# Patient Record
Sex: Female | Born: 1995
Health system: Southern US, Community
[De-identification: ages and names within clinical notes are randomized; demographics above are authoritative.]

## PROBLEM LIST (undated history)

## (undated) DIAGNOSIS — F419 Anxiety disorder, unspecified: Secondary | ICD-10-CM

## (undated) DIAGNOSIS — F329 Major depressive disorder, single episode, unspecified: Secondary | ICD-10-CM

## (undated) DIAGNOSIS — L709 Acne, unspecified: Secondary | ICD-10-CM

## (undated) DIAGNOSIS — F32A Depression, unspecified: Secondary | ICD-10-CM

## (undated) HISTORY — PX: WISDOM TOOTH EXTRACTION: SHX21

## (undated) HISTORY — DX: Anxiety disorder, unspecified: F41.9

## (undated) HISTORY — DX: Acne, unspecified: L70.9

---
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
## 2014-01-28 ENCOUNTER — Ambulatory Visit (INDEPENDENT_AMBULATORY_CARE_PROVIDER_SITE_OTHER): Payer: 59 | Admitting: Physician Assistant

## 2014-01-28 VITALS — BP 100/74 | HR 86 | Temp 98.2°F | Resp 16 | Ht 69.0 in | Wt 183.6 lb

## 2014-01-28 DIAGNOSIS — K2100 Gastro-esophageal reflux disease with esophagitis, without bleeding: Secondary | ICD-10-CM

## 2014-01-28 MED ORDER — RANITIDINE HCL 150 MG PO TABS: 150.0000 mg | ORAL_TABLET | Freq: Two times a day (BID) | ORAL | Status: DC

## 2014-01-28 MED ORDER — ESOMEPRAZOLE MAGNESIUM 40 MG PO CPDR: 40.0000 mg | DELAYED_RELEASE_CAPSULE | Freq: Every day | ORAL | Status: DC

## 2014-01-28 MED ORDER — SUCRALFATE 1 GM/10ML PO SUSP: 1.0000 g | Freq: Three times a day (TID) | ORAL | Status: DC

## 2014-01-28 NOTE — Patient Instructions (Signed)
Decrease your Prozac to 20 mg every day.

## 2014-01-28 NOTE — Progress Notes (Signed)
Subjective:    Patient ID: Jennifer Whitaker, female    DOB: 07/28/1995, 18 y.o.   MRN: [id number]  HPI Pt presents to clinic with upper abdominal pain that has been going on for a while but over the last 3 days it has gotten worse.  She is also feeling like she has something stuck in her throat and she is clearing her throat more than normal.  She is having some PND with a slight cold but she does not think that is her entire problem with the throat clearing and it started a several weeks ago.  She does not notice a difference in some foods causing problems and other not.  She does not smoke or drink ETOH or caffiene beverages.  She is having nausea when she eats and a pain in her epigastric area.  It has been getting worse.  She was diagnosed with laryngeal reflux as a child and took prevacid for years her only symptoms then was chronic clearing of her throat.  Patient Active Problem List   Diagnosis Date Noted  . Insomnia 12/15/2013  . Acne 09/19/2013  . Adjustment disorder with mixed anxiety and depressed mood 05/29/2013  . Reaction to severe stress 05/29/2013  . HSV (herpes simplex virus) infection 11/26/2012   Prior to Admission medications   Medication Sig Start Date End Date Taking? Authorizing Provider  buPROPion (WELLBUTRIN XL) 300 MG 24 hr tablet Take 1 tablet (300 mg total) by mouth daily. 12/15/13  Yes Tonye Pearsonobert P Doolittle, MD  clonazePAM (KLONOPIN) 0.5 MG tablet Take 1 tablet (0.5 mg total) by mouth 3 (three) times daily as needed for anxiety. 08/03/13  Yes Tonye Pearsonobert P Doolittle, MD  FLUoxetine (PROZAC) 20 MG capsule TAKE 3 CAPSULES BY MOUTH DAILY. Patient taking differently: Take 20 mg by mouth daily. TAKE 3 CAPSULES BY MOUTH DAILY. 08/03/13  Yes Tonye Pearsonobert P Doolittle, MD  norgestrel-ethinyl estradiol (LO/OVRAL,CRYSELLE) 0.3-30 MG-MCG tablet Take 1 tablet by mouth daily. 08/03/13  Yes Tonye Pearsonobert P Doolittle, MD  traZODone (DESYREL) 50 MG tablet Take 0.5-1 tablets (25-50 mg total) by mouth at  bedtime as needed for sleep. 12/15/13  Yes Tonye Pearsonobert P Doolittle, MD  valACYclovir (VALTREX) 1000 MG tablet Take 1 tablet (1,000 mg total) by mouth daily. Patient taking differently: Take 1,000 mg by mouth daily as needed.  11/26/12  Yes Sherren MochaEva N Shaw, MD       Morrell RiddleSarah L Janos Shampine, PA-C       Morrell RiddleSarah L Corri Delapaz, PA-C       Morrell RiddleSarah L Deneka Greenwalt, PA-C   No Known Allergies  Medications, allergies, past medical history, surgical history, family history, social history and problem list reviewed and updated.   Gwen - [address] - [facility] - therapist since April and feeling good about it - she feels like her mood is good about once a week she has a day where her mood is bad and she does not want to get out of bed.  She feels like the Wellbutrin is working and she tolerated the decrease in Prozac without problems.    Review of Systems  Constitutional: Negative for fever and chills.  Gastrointestinal: Positive for nausea (only with eating) and abdominal pain (epigsatric). Negative for vomiting, diarrhea and constipation.  Psychiatric/Behavioral: Positive for dysphoric mood. Negative for suicidal ideas and self-injury. The patient is nervous/anxious.        Objective:   Physical Exam  Constitutional: She is oriented to person, place, and time. She appears well-developed and well-nourished.  BP 100/74 mmHg  Pulse 86  Temp(Src) 98.2 F (36.8 C) (Oral)  Resp 16  Ht 5\' 9"  (1.753 m)  Wt 183 lb 9.6 oz (83.28 kg)  BMI 27.10 kg/m2  SpO2 99%  LMP 01/20/2014   HENT:  Head: Normocephalic and atraumatic.  Right Ear: External ear normal.  Left Ear: External ear normal.  Eyes: Conjunctivae are normal.  Cardiovascular: Normal rate, regular rhythm and normal heart sounds.   No murmur heard. Pulmonary/Chest: Effort normal and breath sounds normal. She has no wheezes.  Abdominal: Soft. Bowel sounds are normal. There is tenderness (mild epigastric TTP).  Neurological: She is alert and oriented to person, place, and  time.  Skin: Skin is warm and dry.  Psychiatric: She has a normal mood and affect. Her behavior is normal. Judgment and thought content normal.       Assessment & Plan:  Gastroesophageal reflux disease with esophagitis - Plan: ranitidine (ZANTAC) 150 MG tablet, esomeprazole (NEXIUM) 40 MG capsule, sucralfate (CARAFATE) 1 GM/10ML suspension  Adjustment reaction with depression and anxiety - continue to decrease Prozac to 20mg  and stay on her Wellbutrin 300mg .    Jennifer LennertSarah Shaden Lacher PA-C  [facility] 01/28/2014 5:52 PM

## 2014-02-26 ENCOUNTER — Telehealth: Payer: Self-pay

## 2014-02-26 MED ORDER — PANTOPRAZOLE SODIUM 40 MG PO TBEC: 40.0000 mg | DELAYED_RELEASE_TABLET | Freq: Every day | ORAL | Status: DC

## 2014-02-26 NOTE — Telephone Encounter (Signed)
Pt's new insurance plan now has Pantoprazole for free. Wants to know if she can switch to this instead on Nexium. Thanks [facility]  

## 2014-02-26 NOTE — Telephone Encounter (Signed)
Meds ordered this encounter  Medications  . pantoprazole (PROTONIX) 40 MG tablet    Sig: Take 1 tablet (40 mg total) by mouth daily.    Dispense:  90 tablet    Refill:  1    Order Specific Question:  Supervising Provider    Answer:  DOOLITTLE, ROBERT P [[id number]]

## 2014-02-26 NOTE — Telephone Encounter (Signed)
Pt's mother notified.

## 2014-05-12 ENCOUNTER — Other Ambulatory Visit: Payer: Self-pay | Admitting: Internal Medicine

## 2014-05-23 ENCOUNTER — Other Ambulatory Visit: Payer: Self-pay

## 2014-05-23 MED ORDER — CLONAZEPAM 0.5 MG PO TABS: 0.5000 mg | ORAL_TABLET | Freq: Three times a day (TID) | ORAL | Status: DC | PRN

## 2014-05-23 NOTE — Telephone Encounter (Signed)
Rx. Faxed Clonazepam.

## 2014-05-23 NOTE — Telephone Encounter (Signed)
Pt requesting refills on Clonazepam. Please advise.

## 2014-06-06 ENCOUNTER — Ambulatory Visit (INDEPENDENT_AMBULATORY_CARE_PROVIDER_SITE_OTHER): Payer: 59 | Admitting: Internal Medicine

## 2014-06-06 VITALS — BP 110/70 | HR 89 | Temp 98.2°F | Resp 16 | Ht 71.0 in | Wt 179.4 lb

## 2014-06-06 DIAGNOSIS — F4323 Adjustment disorder with mixed anxiety and depressed mood: Secondary | ICD-10-CM

## 2014-06-06 DIAGNOSIS — F439 Reaction to severe stress, unspecified: Secondary | ICD-10-CM | POA: Diagnosis not present

## 2014-06-06 DIAGNOSIS — G4700 Insomnia, unspecified: Secondary | ICD-10-CM

## 2014-06-06 MED ORDER — CLONAZEPAM 0.5 MG PO TABS: 0.5000 mg | ORAL_TABLET | Freq: Three times a day (TID) | ORAL | Status: DC | PRN

## 2014-06-06 MED ORDER — BUPROPION HCL ER (XL) 300 MG PO TB24: 300.0000 mg | ORAL_TABLET | Freq: Every day | ORAL | Status: DC

## 2014-06-06 MED ORDER — TRAZODONE HCL 50 MG PO TABS: 25.0000 mg | ORAL_TABLET | Freq: Every evening | ORAL | Status: DC | PRN

## 2014-06-06 MED ORDER — FLUOXETINE HCL 20 MG PO CAPS: ORAL_CAPSULE | ORAL | Status: DC

## 2014-06-06 NOTE — Progress Notes (Signed)
Subjective:    Patient ID: Jennifer SauceJulianne M Lurie, female    DOB: 1995/06/23, 19 y.o.   MRN: [id number] This chart was scribed for Ellamae Siaobert Bena Kobel, MD by Jolene Provostobert Halas, Medical Scribe. This patient was seen in Room 8 and the patient's care was started a 10:18 AM.  Chief Complaint  Patient presents with  . Mental Health Problem    Mental Health Problem The primary symptoms include dysphoric mood.   HPI Comments: Jennifer Whitaker is a 19 y.o. female with a hx of adjustment disorder with mixed anxiety and depressed mood who [facility] complaining of stress from work that has become severe over the last couple of months. Pt states her job is not fun, and is very stressful. Pt states she is considering hurting herself, due to stress at work. Pt states she needs to resign due to incompatibility with the work environment. Pt states she is still taking her Prozac and Wellbutrin, and believes they are at a good level. Pt states she is taking her klonopin once every other day. Pt is still taking her trazodone for sleep.  Lives with boyfriend who is very supportive Mother also supportive  Review of Systems  Psychiatric/Behavioral: Positive for self-injury and dysphoric mood. The patient is nervous/anxious.    cardiovascular no chest pain or palpitations Pulmonary No shortness of breath GI equals no abdominal pain    Objective:   Physical Exam  Constitutional: She is oriented to person, place, and time. She appears well-developed and well-nourished. No distress.  HENT:  Head: Normocephalic and atraumatic.  Eyes: Pupils are equal, round, and reactive to light.  Neck: Neck supple.  Cardiovascular: Normal rate.   Pulmonary/Chest: Effort normal. No respiratory distress.  Musculoskeletal: Normal range of motion.  Neurological: She is alert and oriented to person, place, and time. Coordination normal.  Skin: Skin is warm and dry. She is not diaphoretic.  Psychiatric:  Her mood is one of  great anxiety though her affect seems appropriate as she interprets her level of stress. Her thought content is marred by her level of fear about change her job and the repercussions. There is no acute suicide ideation  Nursing note and vitals reviewed.      Assessment & Plan:  Adjustment disorder with mixed anxiety and depressed mood  Reaction to severe stress  Insomnia   Time to refill medications Meds ordered this encounter  Medications  . FLUoxetine (PROZAC) 20 MG capsule    Sig: TAKE 3 CAPSULES BY MOUTH DAILY.    Dispense:  270 capsule    Refill:  3  . buPROPion (WELLBUTRIN XL) 300 MG 24 hr tablet    Sig: Take 1 tablet (300 mg total) by mouth daily.    Dispense:  90 tablet    Refill:  1  . clonazePAM (KLONOPIN) 0.5 MG tablet    Sig: Take 1 tablet (0.5 mg total) by mouth 3 (three) times daily as needed for anxiety.    Dispense:  20 tablet    Refill:  2  . traZODone (DESYREL) 50 MG tablet    Sig: Take 0.5-1 tablets (25-50 mg total) by mouth at bedtime as needed for sleep.    Dispense:  90 tablet    Refill:  1   Referred to counseling Letter written about need to stop work for medical reasons for the next 4 weeks Follow-up 4 weeks  I have completed the patient encounter in its entirety as documented by the scribe, with editing by  me where necessary. Almas Rake P. Laney Pastor, M.D.

## 2014-07-07 ENCOUNTER — Other Ambulatory Visit: Payer: Self-pay | Admitting: Internal Medicine

## 2014-07-12 ENCOUNTER — Ambulatory Visit (INDEPENDENT_AMBULATORY_CARE_PROVIDER_SITE_OTHER): Payer: 59 | Admitting: Physician Assistant

## 2014-07-12 VITALS — BP 100/70 | HR 122 | Temp 99.7°F | Resp 16 | Ht 69.75 in | Wt 178.4 lb

## 2014-07-12 DIAGNOSIS — K529 Noninfective gastroenteritis and colitis, unspecified: Secondary | ICD-10-CM

## 2014-07-12 DIAGNOSIS — R112 Nausea with vomiting, unspecified: Secondary | ICD-10-CM | POA: Diagnosis not present

## 2014-07-12 MED ORDER — ONDANSETRON 4 MG PO TBDP
4.0000 mg | ORAL_TABLET | Freq: Once | ORAL | Status: AC
  Administered 2014-07-12: 4 mg via ORAL

## 2014-07-12 MED ORDER — ONDANSETRON 4 MG PO TBDP: 4.0000 mg | ORAL_TABLET | Freq: Three times a day (TID) | ORAL | Status: DC | PRN

## 2014-07-12 NOTE — Progress Notes (Signed)
Subjective:    Patient ID: Jennifer SauceJulianne M Menter, female    DOB: 02-Jul-1995, 19 y.o.   MRN: [id number]  HPI Pt presents to clinic with her mother and brother feeling terrible with nausea and vomiting.  She started to feel nauseated last night while she was working but then the vomiting started once she got home.  She has myalgias and has only been able to keep down a few sips of liquid today.  She is very nauseated.  She lost a close friend in a car accident 2 days ago (the friend was coming to visit her and [place] an [place] and she is sad about that and she feels that her depression is not well managed over the last few months and would like to have her medications changed.  Review of Systems  Constitutional: Positive for fever (subjective fever) and chills.  HENT: Negative.   Gastrointestinal: Positive for nausea and vomiting (>3 times - last time about 1 hour). Negative for diarrhea.  Genitourinary: Negative.  Negative for menstrual problem.   Patient Active Problem List   Diagnosis Date Noted  . Insomnia 12/15/2013  . Acne 09/19/2013  . Adjustment disorder with mixed anxiety and depressed mood 05/29/2013  . Reaction to severe stress 05/29/2013  . HSV (herpes simplex virus) infection 11/26/2012   The patient has a current medication list which includes the following prescription(s): bupropion, clonazepam, fluoxetine, norgestrel-ethinyl estradiol, pantoprazole, ranitidine, sucralfate, trazodone, and valacyclovir. No Known Allergies  Medications, allergies, past medical history, surgical history, family history, social history and problem list reviewed and updated.      Objective:   Physical Exam  Constitutional: She is oriented to person, place, and time. She appears well-developed and well-nourished. She appears ill.  BP 100/70 mmHg  Pulse 122  Temp(Src) 99.7 F (37.6 C) (Oral)  Resp 16  Ht 5' 9.75" (1.772 m)  Wt 178 lb 6 oz (80.91 kg)  BMI 25.77 kg/m2  SpO2  98%   HENT:  Head: Normocephalic and atraumatic.  Right Ear: External ear normal.  Left Ear: External ear normal.  Eyes: Conjunctivae are normal.  Neck: Normal range of motion.  Cardiovascular: Normal rate, regular rhythm and normal heart sounds.   No murmur heard. Pulmonary/Chest: Effort normal and breath sounds normal. She has no wheezes.  Abdominal: Soft. Bowel sounds are normal. There is no tenderness.  Neurological: She is alert and oriented to person, place, and time.  Skin: Skin is warm and dry.  Psychiatric: She has a normal mood and affect. Her behavior is normal. Judgment and thought content normal.    Orthostatic VS for the past 24 hrs:  BP- Lying Pulse- Lying BP- Sitting Pulse- Sitting BP- Standing at 0 minutes Pulse- Standing at 0 minutes  07/12/14 1644 116/76 mmHg 97 107/68 mmHg 98 123/78 mmHg 120   Pt given zofran and she felt much better in about 10 minutes.    Assessment & Plan:  Non-intractable vomiting with nausea, vomiting of unspecified type - Plan: ondansetron (ZOFRAN-ODT) disintegrating tablet 4 mg  Gastroenteritis - Plan: ondansetron (ZOFRAN ODT) 4 MG disintegrating tablet  Pt to orally hydrate at home.  Hopefully the Zofran will allow her to do so but if she is having trouble she should RTC for a recheck tomorrow and IV fluids.  She will f/u with me for her depression recheck this week at the appt center.  Benny LennertSarah Weber PA-C  [facility] 07/13/2014  10:37 AM

## 2014-07-12 NOTE — Patient Instructions (Signed)
Start with ice chips and then sips of clear fluids.  Once you are able to tolerated drinking liquids you can start with bland foods such as rice, apple sauce and toast.  If you can tolerate this you can advance diet as tolerated.  Limit dairy for several days to reduce return of diarrhea if you have been experiencing diarrhea.  

## 2014-07-13 ENCOUNTER — Encounter: Payer: Self-pay | Admitting: Physician Assistant

## 2014-07-30 ENCOUNTER — Encounter: Payer: Self-pay | Admitting: Physician Assistant

## 2014-07-30 ENCOUNTER — Ambulatory Visit (INDEPENDENT_AMBULATORY_CARE_PROVIDER_SITE_OTHER): Payer: 59 | Admitting: Physician Assistant

## 2014-07-30 VITALS — BP 114/66 | HR 77 | Temp 98.3°F | Resp 16 | Ht 69.75 in | Wt 183.2 lb

## 2014-07-30 DIAGNOSIS — F4323 Adjustment disorder with mixed anxiety and depressed mood: Secondary | ICD-10-CM | POA: Diagnosis not present

## 2014-07-30 MED ORDER — BUPROPION HCL ER (XL) 150 MG PO TB24: 450.0000 mg | ORAL_TABLET | Freq: Every day | ORAL | Status: DC

## 2014-07-30 NOTE — Progress Notes (Signed)
Jennifer Whitaker  MRN: [id number] DOB: 1995-06-03  Subjective:  Pt presents to clinic for a recheck of her depression and anxiety.  She has not been doing well over the last several months and then she had a really close friend die in a car accident about 2 weeks ago and that has made everything worse. She has no energy and no motivation.  She is with her boyfriend who is supportive.  She is working at [employer].  She is eating ok but she knows she does some stress eating on junk foods.  She is worried about her weight.  She is seeing her therapist tomorrow.  She feels like the Wellbutrin did help but now it is not helping at all.  She feels like the trazodone helps her sleep.  She does not take her Klonopin daily.  Her depression has been worse than her anxiety recently.  Auel -- therapist  Patient Active Problem List   Diagnosis Date Noted  . Insomnia 12/15/2013  . Acne 09/19/2013  . Adjustment disorder with mixed anxiety and depressed mood 05/29/2013  . Reaction to severe stress 05/29/2013  . HSV (herpes simplex virus) infection 11/26/2012    Current Outpatient Prescriptions on File Prior to Visit  Medication Sig Dispense Refill  . buPROPion (WELLBUTRIN XL) 300 MG 24 hr tablet Take 1 tablet (300 mg total) by mouth daily. 90 tablet 1  . clonazePAM (KLONOPIN) 0.5 MG tablet Take 1 tablet (0.5 mg total) by mouth 3 (three) times daily as needed for anxiety. 20 tablet 2  . FLUoxetine (PROZAC) 20 MG capsule TAKE 3 CAPSULES BY MOUTH DAILY. 270 capsule 3  . norgestrel-ethinyl estradiol (LO/OVRAL,CRYSELLE) 0.3-30 MG-MCG tablet Take 1 tablet by mouth daily. 1 Package 11  . ondansetron (ZOFRAN ODT) 4 MG disintegrating tablet Take 1-2 tablets (4-8 mg total) by mouth every 8 (eight) hours as needed for nausea. 20 tablet 0  . pantoprazole (PROTONIX) 40 MG tablet Take 1 tablet (40 mg total) by mouth daily. 90 tablet 1  . ranitidine (ZANTAC) 150 MG tablet Take 1 tablet (150 mg total) by mouth 2  (two) times daily. 60 tablet 0  . sucralfate (CARAFATE) 1 GM/10ML suspension Take 10 mLs (1 g total) by mouth 4 (four) times daily -  with meals and at bedtime. 420 mL 0  . traZODone (DESYREL) 50 MG tablet Take 0.5-1 tablets (25-50 mg total) by mouth at bedtime as needed for sleep. (Patient taking differently: Take 25 mg by mouth at bedtime as needed for sleep. ) 90 tablet 1  . valACYclovir (VALTREX) 1000 MG tablet Take 1 tablet (1,000 mg total) by mouth daily. (Patient taking differently: Take 1,000 mg by mouth daily as needed. ) 90 tablet 3   No current facility-administered medications on file prior to visit.    No Known Allergies   Review of Systems  Psychiatric/Behavioral: Positive for dysphoric mood. Negative for sleep disturbance and self-injury. The patient is nervous/anxious.    Objective:  BP 114/66 mmHg  Pulse 77  Temp(Src) 98.3 F (36.8 C) (Oral)  Resp 16  Ht 5' 9.75" (1.772 m)  Wt 183 lb 3.2 oz (83.099 kg)  BMI 26.46 kg/m2  SpO2 99%  LMP 07/21/2014  Physical Exam  Constitutional: She is oriented to person, place, and time and well-developed, well-nourished, and in no distress.  HENT:  Head: Normocephalic and atraumatic.  Right Ear: Hearing and external ear normal.  Left Ear: Hearing and external ear normal.  Eyes: Conjunctivae are normal.  Neck: Normal range of motion.  Cardiovascular:  No murmur heard. Pulmonary/Chest: Effort normal and breath sounds normal.  Neurological: She is alert and oriented to person, place, and time. Gait normal.  Skin: Skin is warm and dry.  Psychiatric: Her affect is not inappropriate. She is not agitated. She exhibits a depressed mood. She expresses no suicidal plans. She has a flat affect.    Assessment and Plan :  Adjustment disorder with mixed anxiety and depressed mood - Plan: buPROPion (WELLBUTRIN XL) 150 MG 24 hr tablet We will increase her Wellbutrin to  daily and recheck in a month.  At that time if she is no better  we will consider changing the Prozac to another SSRI or maybe even a SNRI that will treat her anxiety and depression.  She will continue with therapy as that should be very helpful to the patient.  Benny Lennert PA-C  [facility] 07/30/2014 6:01 PM

## 2014-08-13 ENCOUNTER — Other Ambulatory Visit: Payer: Self-pay | Admitting: Internal Medicine

## 2014-08-27 ENCOUNTER — Ambulatory Visit (INDEPENDENT_AMBULATORY_CARE_PROVIDER_SITE_OTHER): Payer: 59 | Admitting: Family Medicine

## 2014-08-27 VITALS — BP 118/72 | HR 86 | Temp 98.7°F | Resp 18 | Ht 70.0 in | Wt 183.0 lb

## 2014-08-27 DIAGNOSIS — Z3041 Encounter for surveillance of contraceptive pills: Secondary | ICD-10-CM | POA: Diagnosis not present

## 2014-08-27 DIAGNOSIS — N6489 Other specified disorders of breast: Secondary | ICD-10-CM | POA: Diagnosis not present

## 2014-08-27 DIAGNOSIS — N6011 Diffuse cystic mastopathy of right breast: Secondary | ICD-10-CM

## 2014-08-27 DIAGNOSIS — Z3009 Encounter for other general counseling and advice on contraception: Secondary | ICD-10-CM

## 2014-08-27 MED ORDER — NORGESTREL-ETHINYL ESTRADIOL 0.3-30 MG-MCG PO TABS: ORAL_TABLET | ORAL | Status: DC

## 2014-08-27 NOTE — Progress Notes (Signed)
Subjective:    Patient ID: Jennifer Whitaker, female    DOB: 1995-04-30, 19 y.o.   MRN: [id number] Chief Complaint  Patient presents with  . lumb in breast    left notced monday night   . Depression    see screen    HPI Noticed a tiny mass the size of a PE under her left aerola yest. No tenderness, overlying skin changes, pain, or drainage. No prior h/o anything similar.  Just had anti-depresent recently adjusted/refilled so hopefully sxs will begin to improve soon  Depression screen [facility] 2/9 08/27/2014 07/30/2014  Decreased Interest 1 2  Down, Depressed, Hopeless 2 2  PHQ - 2 Score 3 4  Altered sleeping 2 0  Tired, decreased energy 3 3  Change in appetite 1 2  Feeling bad or failure about yourself  3 3  Trouble concentrating 2 3  Moving slowly or fidgety/restless 2 0  Suicidal thoughts 1 0  PHQ-9 Score 17 15  Difficult doing work/chores Somewhat difficult Very difficult     Past Medical History  Diagnosis Date  . Depression   . Acne   . Anxiety    Past Surgical History  Procedure Laterality Date  . Wisdom tooth extraction Bilateral    Current Outpatient Prescriptions on File Prior to Visit  Medication Sig Dispense Refill  . buPROPion (WELLBUTRIN XL) 150 MG 24 hr tablet Take 3 tablets (450 mg total) by mouth daily. 270 tablet 0  . buPROPion (WELLBUTRIN XL) 300 MG 24 hr tablet Take 1 tablet (300 mg total) by mouth daily. 90 tablet 1  . clonazePAM (KLONOPIN) 0.5 MG tablet Take 1 tablet (0.5 mg total) by mouth 3 (three) times daily as needed for anxiety. 20 tablet 2  . FLUoxetine (PROZAC) 20 MG capsule TAKE 3 CAPSULES BY MOUTH DAILY. 270 capsule 3  . norgestrel-ethinyl estradiol (ELINEST) 0.3-30 MG-MCG tablet TAKE 1 TABLET BY MOUTH DAILY. 28 tablet 0  . ondansetron (ZOFRAN ODT) 4 MG disintegrating tablet Take 1-2 tablets (4-8 mg total) by mouth every 8 (eight) hours as needed for nausea. 20 tablet 0  . pantoprazole (PROTONIX) 40 MG tablet Take 1 tablet (40 mg total) by  mouth daily. 90 tablet 1  . ranitidine (ZANTAC) 150 MG tablet Take 1 tablet (150 mg total) by mouth 2 (two) times daily. 60 tablet 0  . sucralfate (CARAFATE) 1 GM/10ML suspension Take 10 mLs (1 g total) by mouth 4 (four) times daily -  with meals and at bedtime. 420 mL 0  . traZODone (DESYREL) 50 MG tablet Take 0.5-1 tablets (25-50 mg total) by mouth at bedtime as needed for sleep. (Patient taking differently: Take 25 mg by mouth at bedtime as needed for sleep. ) 90 tablet 1  . valACYclovir (VALTREX) 1000 MG tablet Take 1 tablet (1,000 mg total) by mouth daily. (Patient taking differently: Take 1,000 mg by mouth daily as needed. ) 90 tablet 3   No current facility-administered medications on file prior to visit.   No Known Allergies Family History  Problem Relation Age of Onset  . Cancer Paternal Grandfather    History   Social History  . Marital Status: Single    Spouse Name: n/a  . Number of Children: 0  . Years of Education: 12+   Occupational History  . waitress     [employer]   Social History Main Topics  . Smoking status: Never Smoker   . Smokeless tobacco: Never Used  . Alcohol Use: No  .  Drug Use: No  . Sexual Activity: No   Other Topics Concern  . None   Social History Narrative   Lives with her boyfriend.  Mother lives nearby. Her father lives nearby. Her brother lives nearby.     Review of Systems  Constitutional: Negative for fever, chills, diaphoresis, activity change, appetite change and unexpected weight change.  Cardiovascular: Negative for chest pain.  Genitourinary: Negative for vaginal bleeding, vaginal discharge and menstrual problem.  Hematological: Negative for adenopathy.  Psychiatric/Behavioral: The patient is nervous/anxious.        Objective:  BP 118/72 mmHg  Pulse 86  Temp(Src) 98.7 F (37.1 C) (Oral)  Resp 18  Ht 5\' 10"  (1.778 m)  Wt 183 lb (83.008 kg)  BMI 26.26 kg/m2  SpO2 98%  LMP 08/13/2014  Physical Exam  Constitutional:  She is oriented to person, place, and time. She appears well-developed and well-nourished. No distress.  HENT:  Head: Normocephalic and atraumatic.  Right Ear: External ear normal.  Eyes: Conjunctivae are normal. No scleral icterus.  Pulmonary/Chest: Effort normal. Right breast exhibits no inverted nipple, no mass, no nipple discharge and no skin change. Left breast exhibits mass. Left breast exhibits no inverted nipple, no nipple discharge, no skin change and no tenderness. Breasts are symmetrical.    Neurological: She is alert and oriented to person, place, and time.  Skin: Skin is warm and dry. She is not diaphoretic. No erythema.  Psychiatric: She has a normal mood and affect. Her behavior is normal.     2-153mm firm mobile nodule at 5 oclock under left areola.    Assessment & Plan:   1. Family planning - refilled ocps  2. Fibrocystic breast changes, right   3. Occlusion of breast duct  - provided reassurance - try warm moist compresses to nipple - do not manipulate - rtc and get US/mammo if increases in size or becomes painful     Meds ordered this encounter  Medications  . norgestrel-ethinyl estradiol (ELINEST) 0.3-30 MG-MCG tablet    Sig: TAKE 1 TABLET BY MOUTH DAILY.    Dispense:  74 tablet    Refill:  3    Norberto SorensonEva Darryel Diodato, MD MPH

## 2014-08-27 NOTE — Patient Instructions (Signed)
Warm wet compresses 4 times a day, do not manipulate. If still present towards end of the month, call and we can set you up for an ultrasound.  This is just a little plugged duct.  Fibrocystic Breast Changes Fibrocystic breast changes occur when breast ducts become blocked, causing painful, fluid-filled lumps (cysts) to form in the breast. This is a common condition that is noncancerous (benign). It occurs when women go through hormonal changes during their menstrual cycle. Fibrocystic breast changes can affect one or both breasts. CAUSES  The exact cause of fibrocystic breast changes is not known, but it may be related to the female hormones estrogen and progesterone. Family traits that get passed from parent to child (genetics) may also be a factor in some cases. SIGNS AND SYMPTOMS   Tenderness, mild discomfort, or pain.   Swelling.   Ropelike feeling when touching the breast.   Lumpy breast, one or both sides.   Changes in breast size, especially before (larger) and after (smaller) the menstrual period.   Green or dark brown nipple discharge (not blood).  Symptoms are usually worse before menstrual periods start and get better toward the end of the menstrual period.  DIAGNOSIS  To make a diagnosis, your health care provider will ask you questions and perform a physical exam of your breasts. The health care provider may recommend other tests that can examine inside your breasts, such as:  A breast X-ray (mammogram).   Ultrasonography.  An MRI.  If something more than fibrocystic breast changes is suspected, your health care provider may take a breast tissue sample (breast biopsy) to examine. TREATMENT  Often, treatment is not needed. Your health care provider may recommend over-the-counter pain relievers to help lessen pain or discomfort caused by the fibrocystic breast changes. You may also be asked to change your diet to limit or stop eating foods or drinking beverages that  contain caffeine. Foods and beverages that contain caffeine include chocolate, soda, coffee, and tea. Reducing sugar and fat in your diet may also help. Your health care provider may also recommend:  Fine needle aspiration to remove fluid from a cyst that is causing pain.   Surgery to remove a large, persistent, and tender cyst. HOME CARE INSTRUCTIONS   Examine your breasts after every menstrual period. If you do not have menstrual periods, check your breasts the first day of every month. Feel for changes, such as more tenderness, a new growth, a change in breast size, or a change in a lump that has always been there.   Only take over-the-counter or prescription medicine as directed by your health care provider.   Wear a well-fitted support or sports bra, especially when exercising.   Decrease or avoid caffeine, fat, and sugar in your diet as directed by your health care provider.  SEEK MEDICAL CARE IF:   You have fluid leaking (discharge) from your nipples, especially bloody discharge.   You have new lumps or bumps in the breast.   Your breast or breasts become enlarged, red, and painful.   You have areas of your breast that pucker in.   Your nipples appear flat or indented.  Document Released: 11/24/2005 Document Revised: 02/12/2013 Document Reviewed: 07/29/2012 [facility] Patient Information 2015 [employer] [place]. This information is not intended to replace advice given to you by your health care provider. Make sure you discuss any questions you have with your health care provider.

## 2014-09-02 ENCOUNTER — Other Ambulatory Visit: Payer: Self-pay | Admitting: Physician Assistant

## 2014-09-10 ENCOUNTER — Ambulatory Visit (INDEPENDENT_AMBULATORY_CARE_PROVIDER_SITE_OTHER): Payer: 59 | Admitting: Physician Assistant

## 2014-09-10 ENCOUNTER — Encounter: Payer: Self-pay | Admitting: Physician Assistant

## 2014-09-10 VITALS — BP 110/76 | HR 85 | Temp 98.6°F | Resp 16 | Ht 69.5 in | Wt 180.0 lb

## 2014-09-10 DIAGNOSIS — F4323 Adjustment disorder with mixed anxiety and depressed mood: Secondary | ICD-10-CM | POA: Diagnosis not present

## 2014-09-10 MED ORDER — BUPROPION HCL ER (XL) 150 MG PO TB24: 450.0000 mg | ORAL_TABLET | Freq: Every day | ORAL | Status: DC

## 2014-09-10 NOTE — Progress Notes (Signed)
Jennifer Whitaker  MRN: [id number] DOB: 1995-08-13  Subjective:  Pt presents to clinic for a recheck.  Since the increase in her dose she has more motivation and energy.  She has also been exercising which she feels like is helping.  She has decreased her amount of time she is allowing for sleep but she still feels ok.  She is still seeing her therapist but she has not been talking about her friends death yet - she is still grieving but she is not ready to talk about it yet.  Rare use of Klonopin - none since the increase in her wellbutrin.  Patient Active Problem List   Diagnosis Date Noted  . Insomnia 12/15/2013  . Acne 09/19/2013  . Adjustment disorder with mixed anxiety and depressed mood 05/29/2013  . Reaction to severe stress 05/29/2013  . HSV (herpes simplex virus) infection 11/26/2012    Current Outpatient Prescriptions on File Prior to Visit  Medication Sig Dispense Refill  . clonazePAM (KLONOPIN) 0.5 MG tablet Take 1 tablet (0.5 mg total) by mouth 3 (three) times daily as needed for anxiety. 20 tablet 2  . FLUoxetine (PROZAC) 20 MG capsule TAKE 3 CAPSULES BY MOUTH DAILY. (Patient taking differently: Take 20 mg by mouth daily. TAKE 3 CAPSULES BY MOUTH DAILY.) 270 capsule 3  . norgestrel-ethinyl estradiol (ELINEST) 0.3-30 MG-MCG tablet TAKE 1 TABLET BY MOUTH DAILY. 74 tablet 3  . pantoprazole (PROTONIX) 40 MG tablet TAKE 1 TABLET BY MOUTH ONCE DAILY 90 tablet 0  . traZODone (DESYREL) 50 MG tablet Take 0.5-1 tablets (25-50 mg total) by mouth at bedtime as needed for sleep. (Patient taking differently: Take 25 mg by mouth at bedtime as needed for sleep. ) 90 tablet 1  . ondansetron (ZOFRAN ODT) 4 MG disintegrating tablet Take 1-2 tablets (4-8 mg total) by mouth every 8 (eight) hours as needed for nausea. (Patient not taking: Reported on 09/10/2014) 20 tablet 0  . valACYclovir (VALTREX) 1000 MG tablet Take 1 tablet (1,000 mg total) by mouth daily. (Patient not taking: Reported on  09/10/2014) 90 tablet 3   No current facility-administered medications on file prior to visit.    No Known Allergies  Review of Systems  Psychiatric/Behavioral: Positive for sleep disturbance and dysphoric mood. The patient is nervous/anxious.    Objective:  BP 110/76 mmHg  Pulse 85  Temp(Src) 98.6 F (37 C) (Oral)  Resp 16  Ht 5' 9.5" (1.765 m)  Wt 180 lb (81.647 kg)  BMI 26.21 kg/m2  SpO2 98%  LMP 09/06/2014  Physical Exam  Constitutional: She is oriented to person, place, and time and well-developed, well-nourished, and in no distress.  HENT:  Head: Normocephalic and atraumatic.  Right Ear: Hearing and external ear normal.  Left Ear: Hearing and external ear normal.  Eyes: Conjunctivae are normal.  Neck: Normal range of motion.  Pulmonary/Chest: Effort normal.  Neurological: She is alert and oriented to person, place, and time. Gait normal.  Skin: Skin is warm and dry.  Psychiatric: Mood, memory, affect and judgment normal.  Vitals reviewed.   Assessment and Plan :  Adjustment disorder with mixed anxiety and depressed mood - Plan: buPROPion (WELLBUTRIN XL) 150 MG 24 hr tablet  Pt to continue counseling.  She is not yet ready to talk about her friends death and we talk ed about how it will be difficult but it may be very helpful for her.  She is doing much better and I am so glad.  She is a  little nervous about school next month but she is hopeful today.  She will recheck with me in 3 months unless she has troulbe before then.  Benny LennertSarah Mesha Schamberger PA-C  [facility] 09/10/2014 3:25 PM

## 2014-12-10 ENCOUNTER — Telehealth: Payer: Self-pay

## 2014-12-10 DIAGNOSIS — F4323 Adjustment disorder with mixed anxiety and depressed mood: Secondary | ICD-10-CM

## 2014-12-10 NOTE — Telephone Encounter (Signed)
Pt reports increased dose of Wellbutrin is working well and did not feel she needed to see you right now. Is this ok with you? Can she see in 3 months? Please advise.

## 2014-12-11 ENCOUNTER — Ambulatory Visit: Payer: 59 | Admitting: Physician Assistant

## 2014-12-11 MED ORDER — BUPROPION HCL ER (XL) 150 MG PO TB24: 450.0000 mg | ORAL_TABLET | Freq: Every day | ORAL | Status: DC

## 2014-12-11 NOTE — Telephone Encounter (Signed)
If she is doing well - great - that is fine but I need to see her sooner than 3 months if anything changes.

## 2015-01-19 ENCOUNTER — Encounter: Payer: Self-pay | Admitting: Internal Medicine

## 2015-01-30 ENCOUNTER — Other Ambulatory Visit: Payer: Self-pay | Admitting: Internal Medicine

## 2015-02-25 DIAGNOSIS — H5213 Myopia, bilateral: Secondary | ICD-10-CM | POA: Diagnosis not present

## 2015-03-11 DIAGNOSIS — F330 Major depressive disorder, recurrent, mild: Secondary | ICD-10-CM | POA: Diagnosis not present

## 2015-03-30 MED FILL — clonazePAM 0.5 MG TABS: 0.5 | 3 days supply | Qty: 10 | Fill #0

## 2015-04-04 ENCOUNTER — Ambulatory Visit (INDEPENDENT_AMBULATORY_CARE_PROVIDER_SITE_OTHER): Payer: 59 | Admitting: Physician Assistant

## 2015-04-04 VITALS — BP 110/80 | HR 99 | Temp 98.6°F | Resp 18 | Ht 70.0 in | Wt 174.8 lb

## 2015-04-04 DIAGNOSIS — J069 Acute upper respiratory infection, unspecified: Secondary | ICD-10-CM

## 2015-04-04 DIAGNOSIS — R12 Heartburn: Secondary | ICD-10-CM | POA: Diagnosis not present

## 2015-04-04 DIAGNOSIS — Z3041 Encounter for surveillance of contraceptive pills: Secondary | ICD-10-CM | POA: Diagnosis not present

## 2015-04-04 DIAGNOSIS — F4323 Adjustment disorder with mixed anxiety and depressed mood: Secondary | ICD-10-CM | POA: Diagnosis not present

## 2015-04-04 DIAGNOSIS — L989 Disorder of the skin and subcutaneous tissue, unspecified: Secondary | ICD-10-CM

## 2015-04-04 DIAGNOSIS — B9789 Other viral agents as the cause of diseases classified elsewhere: Secondary | ICD-10-CM

## 2015-04-04 MED ORDER — FLUOXETINE HCL 20 MG PO CAPS: 20.0000 mg | ORAL_CAPSULE | Freq: Every day | ORAL | Status: DC

## 2015-04-04 MED ORDER — IPRATROPIUM BROMIDE 0.06 % NA SOLN: 2.0000 | Freq: Three times a day (TID) | NASAL | Status: DC

## 2015-04-04 MED ORDER — BUPROPION HCL ER (XL) 150 MG PO TB24: 450.0000 mg | ORAL_TABLET | Freq: Every day | ORAL | Status: DC

## 2015-04-04 MED ORDER — NORGESTREL-ETHINYL ESTRADIOL 0.3-30 MG-MCG PO TABS: ORAL_TABLET | ORAL | Status: DC

## 2015-04-04 MED ORDER — HYDROCOD POLST-CPM POLST ER 10-8 MG/5ML PO SUER: 5.0000 mL | Freq: Two times a day (BID) | ORAL | Status: DC | PRN

## 2015-04-04 MED ORDER — PANTOPRAZOLE SODIUM 40 MG PO TBEC: DELAYED_RELEASE_TABLET | ORAL | Status: DC

## 2015-04-04 MED ORDER — TRAZODONE HCL 50 MG PO TABS: 25.0000 mg | ORAL_TABLET | Freq: Every evening | ORAL | Status: DC | PRN

## 2015-04-04 MED ORDER — GUAIFENESIN ER 1200 MG PO TB12
1.0000 | ORAL_TABLET | Freq: Two times a day (BID) | ORAL | Status: AC
Start: 2015-04-04 — End: 2015-04-11

## 2015-04-04 MED ORDER — FLUOCINOLONE ACETONIDE 0.01 % EX SHAM: 1.0000 "application " | MEDICATED_SHAMPOO | Freq: Every day | CUTANEOUS | Status: DC

## 2015-04-04 NOTE — Progress Notes (Signed)
Jennifer Whitaker  MRN: [id number] DOB: 1995/04/13  Subjective:  Pt presents to clinic with 2 concerns 1- cold symptoms for about 4-5 days that started with sinus congestion and cough.  She is having some rhinorrhea and then this am she had a nose bleed after blowing.  She has a dry cough that is keeping her up at night.  She has no myalgias.  Her throat is sore and got more sore yesterday but she feels like the PND and the cough is also getting worse.  She is having no SOB or wheezing.  Home treatment - tylenol  Works at [employer] with many sick contacts  Mental health is doing good and she needs refills of her medications. - she feels stable - she wants to continue on her current dose of medication because she feels good.  Once a month - therapist - Auel - [facility] - really working well with her and feels like this is the 1st time she has eve connected with a therapist.  Needs refill on her other medications.  She has an area on the back of her scalp which is very itchy.  She has had it for a while - it was going down the back of her neck but that has resolved.  She has tried steroid cream but it just gets in her hair and she feels like it does not really get on the itchy spot.  Patient Active Problem List   Diagnosis Date Noted  . Insomnia 12/15/2013  . Acne 09/19/2013  . Adjustment disorder with mixed anxiety and depressed mood 05/29/2013  . Reaction to severe stress 05/29/2013  . HSV (herpes simplex virus) infection 11/26/2012    Current Outpatient Prescriptions on File Prior to Visit  Medication Sig Dispense Refill  . clonazePAM (KLONOPIN) 0.5 MG tablet TAKE 1 TABLET BY MOUTH 3 TIMES DAILY AS NEEDED FOR ANXIETY 10 tablet 2  . ondansetron (ZOFRAN ODT) 4 MG disintegrating tablet Take 1-2 tablets (4-8 mg total) by mouth every 8 (eight) hours as needed for nausea. 20 tablet 0  . valACYclovir (VALTREX) 1000 MG tablet Take 1 tablet (1,000 mg total) by mouth daily. 90  tablet 3   No current facility-administered medications on file prior to visit.    No Known Allergies  Review of Systems  Constitutional: Positive for fatigue. Negative for fever and chills.  HENT: Positive for congestion, nosebleeds (this am ) and sore throat (typically wors in the am but it seems to be getting worse). Negative for postnasal drip.   Respiratory: Positive for cough (dry). Negative for shortness of breath and wheezing.   Musculoskeletal: Negative for myalgias.  Neurological: Positive for headaches.   Objective:  BP 110/80 mmHg  Pulse 99  Temp(Src) 98.6 F (37 C) (Oral)  Resp 18  Ht  (1.778 m)  Wt 174 lb 12.8 oz (79.289 kg)  BMI 25.08 kg/m2  SpO2 98%  LMP 03/18/2015  Physical Exam  Constitutional: She is oriented to person, place, and time and well-developed, well-nourished, and in no distress.  HENT:  Head: Normocephalic and atraumatic.  Right Ear: Hearing, tympanic membrane, external ear and ear canal normal.  Left Ear: Hearing, tympanic membrane, external ear and ear canal normal.  Nose: Nose lacerations (scrap on the left nasal septum at the nare) present.  Mouth/Throat: Uvula is midline, oropharynx is clear and moist and mucous membranes are normal.  Eyes: Conjunctivae are normal.  Neck: Normal range of motion.  Cardiovascular: Normal rate,  regular rhythm and normal heart sounds.   No murmur heard. Pulmonary/Chest: Effort normal and breath sounds normal.  Neurological: She is alert and oriented to person, place, and time. Gait normal.  Skin: Skin is warm and dry.     Psychiatric: Memory and judgment normal. Her mood appears not anxious. She exhibits a depressed mood. She expresses no suicidal plans. She has a flat affect (normal for her).  Vitals reviewed.   Assessment and Plan :  Adjustment disorder with mixed anxiety and depressed mood - Plan: buPROPion (WELLBUTRIN XL) 150 MG 24 hr tablet, FLUoxetine (PROZAC) 20 MG capsule, traZODone  (DESYREL) 50 MG tablet - continue current medications - we discussed maybe changing her prozac down at some point but she is not comfortable with that currently.  Viral URI with cough - Plan: ipratropium (ATROVENT) 0.06 % nasal spray, chlorpheniramine-HYDROcodone (TUSSIONEX PENNKINETIC ER) 10-8 MG/5ML SUER, Guaifenesin (MUCINEX MAXIMUM STRENGTH) 1200 MG TB12 - other symptomatic care d/w pt.  Heartburn - Plan: pantoprazole (PROTONIX) 40 MG tablet - continue medication  Encounter for surveillance of contraceptive pills - Plan: norgestrel-ethinyl estradiol (ELINEST) 0.3-30 MG-MCG tablet - continue medication  Scalp lesion - Plan: Fluocinolone Acetonide 0.01 % SHAM - trial of steroid shampoo as the patient has eczema and this is possible a lesion - if no better and the dandruff gets worse trial of ketoconazole will be called into the pharmacy for the patient.  Benny Lennert PA-C  [facility] 04/04/2015 3:12 PM

## 2015-04-06 ENCOUNTER — Telehealth: Payer: Self-pay | Admitting: Family Medicine

## 2015-04-06 MED ORDER — CLOBETASOL PROPIONATE 0.05 % EX SHAM: 1.0000 "application " | MEDICATED_SHAMPOO | Freq: Every day | CUTANEOUS | Status: DC

## 2015-04-06 MED FILL — CLOBETASOL 0.05% SHAMPOO: 0.05 | 30 days supply | Qty: 118 | Fill #0

## 2015-04-06 MED FILL — PANTOPRAZOLE SOD DR 40 MG T: 40 | 90 days supply | Qty: 90 | Fill #0

## 2015-04-06 NOTE — Telephone Encounter (Signed)
Patient was prescribed steroid shampoo and it was too expensive. Could we send in generic Clobex to [facility]? Please advise

## 2015-04-06 NOTE — Telephone Encounter (Signed)
Done

## 2015-04-07 NOTE — Telephone Encounter (Signed)
lmom notifying.

## 2015-04-22 DIAGNOSIS — F330 Major depressive disorder, recurrent, mild: Secondary | ICD-10-CM | POA: Diagnosis not present

## 2015-05-04 MED FILL — BUPROPION HCL XL 150 MG TAB: 150 | 90 days supply | Qty: 270 | Fill #1

## 2015-05-07 ENCOUNTER — Other Ambulatory Visit: Payer: Self-pay | Admitting: Family Medicine

## 2015-05-13 ENCOUNTER — Ambulatory Visit (INDEPENDENT_AMBULATORY_CARE_PROVIDER_SITE_OTHER): Payer: 59 | Admitting: Family Medicine

## 2015-05-13 VITALS — BP 120/62 | HR 84 | Temp 98.8°F | Resp 20 | Ht 70.0 in | Wt 179.4 lb

## 2015-05-13 DIAGNOSIS — N6320 Unspecified lump in the left breast, unspecified quadrant: Secondary | ICD-10-CM

## 2015-05-13 DIAGNOSIS — N63 Unspecified lump in breast: Secondary | ICD-10-CM

## 2015-05-13 NOTE — Progress Notes (Signed)
   Subjective:    Patient ID: Jennifer Whitaker, female    DOB: 04/09/1995, 20 y.o.   MRN: [id number]  HPI This is a pleasant 20 yo female who presents today for follow up of left breast mass. Patient's mother present for part of visit. She was seen 08/27/14 for same area of concern. Dr. Clelia CroftShaw diagnosed breast duct occlusion and instructed patient to come in for further eval if increased size/pain were to occur. Patient thinks it might be a little larger. No pain. She has avoided manipulating the area. No nipple discharge noted. No new lesions palpated.   Past Medical History  Diagnosis Date  . Depression   . Acne   . Anxiety    Past Surgical History  Procedure Laterality Date  . Wisdom tooth extraction Bilateral    Family History  Problem Relation Age of Onset  . Cancer Paternal Grandfather    Social History  Substance Use Topics  . Smoking status: Never Smoker   . Smokeless tobacco: Never Used  . Alcohol Use: No     Review of Systems Per HPI    Objective:   Physical Exam  Constitutional: She is oriented to person, place, and time. She appears well-developed and well-nourished. No distress.  HENT:  Head: Normocephalic and atraumatic.  Eyes: Conjunctivae are normal.  Cardiovascular: Normal rate.   Pulmonary/Chest: Effort normal.    Musculoskeletal: Normal range of motion.  Neurological: She is alert and oriented to person, place, and time.  Skin: Skin is warm and dry. She is not diaphoretic.  Psychiatric: She has a normal mood and affect. Her behavior is normal. Judgment and thought content normal.  Vitals reviewed.    BP 120/62 mmHg  Pulse 84  Temp(Src) 98.8 F (37.1 C) (Oral)  Resp 20  Ht 5\' 10"  (1.778 m)  Wt 179 lb 6.4 oz (81.375 kg)  BMI 25.74 kg/m2  SpO2 98%  LMP 04/29/2015 [facility] 05/13/2015 04/04/2015 08/27/2014 07/30/2014  Decreased Interest 0 0 1 2  Down, Depressed, Hopeless 0 0 2 2  PHQ - 2 Score 0 0 3 4  Altered sleeping - - 2 0    Tired, decreased energy - - 3 3  Change in appetite - - 1 2  Feeling bad or failure about yourself  - - 3 3  Trouble concentrating - - 2 3  Moving slowly or fidgety/restless - - 2 0  Suicidal thoughts - - 1 0  PHQ-9 Score - - 17 15  Difficult doing work/chores - - Somewhat difficult Very difficult         Assessment & Plan:  1. Left breast mass - US left breast, complete including axilla   Olean Reeeborah Diogenes Whirley, FNP-BC  [facility]  05/15/2015 2:09 PM

## 2015-05-13 NOTE — Patient Instructions (Addendum)
We will call about your ultrasound    IF you received an x-ray today, you will [facility] an [facility]. Please contact [facility] at [phone] with questions or concerns regarding your invoice.   IF you received labwork today, you will receive an invoice from [facility]. Please contact [facility] at [phone] with questions or concerns regarding your invoice.   Our billing staff will not be able to assist you with questions regarding bills from these companies.  You will be contacted with the lab results as soon as they are available. The fastest way to get your results is to activate your My Chart account. Instructions are located on the last page of this paperwork. If you have not heard from us regarding the results in 2 weeks, please contact this office.

## 2015-05-14 ENCOUNTER — Other Ambulatory Visit: Payer: Self-pay | Admitting: Family Medicine

## 2015-05-14 DIAGNOSIS — N6320 Unspecified lump in the left breast, unspecified quadrant: Secondary | ICD-10-CM

## 2015-05-18 MED FILL — ELINEST-28 TABLET: 0.3-30 | 84 days supply | Qty: 84 | Fill #0

## 2015-05-20 DIAGNOSIS — F330 Major depressive disorder, recurrent, mild: Secondary | ICD-10-CM | POA: Diagnosis not present

## 2015-06-15 ENCOUNTER — Other Ambulatory Visit: Payer: Self-pay | Admitting: Family Medicine

## 2015-06-19 ENCOUNTER — Ambulatory Visit
Admission: RE | Admit: 2015-06-19 | Discharge: 2015-06-19 | Disposition: A | Discharge status: Home / Self Care | Payer: 59 | Source: Ambulatory Visit | Attending: Family Medicine | Admitting: Family Medicine

## 2015-06-19 DIAGNOSIS — N63 Unspecified lump in breast: Secondary | ICD-10-CM | POA: Diagnosis not present

## 2015-06-19 DIAGNOSIS — N6320 Unspecified lump in the left breast, unspecified quadrant: Secondary | ICD-10-CM

## 2015-06-23 DIAGNOSIS — F330 Major depressive disorder, recurrent, mild: Secondary | ICD-10-CM | POA: Diagnosis not present

## 2015-07-01 DIAGNOSIS — F330 Major depressive disorder, recurrent, mild: Secondary | ICD-10-CM | POA: Diagnosis not present

## 2015-07-31 DIAGNOSIS — F330 Major depressive disorder, recurrent, mild: Secondary | ICD-10-CM | POA: Diagnosis not present

## 2015-08-03 MED FILL — traZODone HCL 50 MG TABS: 50 | 90 days supply | Qty: 90 | Fill #0

## 2015-08-03 MED FILL — ELINEST-28 TABLET: 0.3-30 | 84 days supply | Qty: 84 | Fill #1

## 2015-08-03 MED FILL — BUPROPION HCL XL 150 MG TAB: 150 | 90 days supply | Qty: 270 | Fill #0

## 2015-08-04 MED FILL — PANTOPRAZOLE SOD DR 40 MG T: 40 | 90 days supply | Qty: 90 | Fill #1

## 2015-08-28 DIAGNOSIS — F330 Major depressive disorder, recurrent, mild: Secondary | ICD-10-CM | POA: Diagnosis not present

## 2015-09-09 ENCOUNTER — Encounter: Payer: Self-pay | Admitting: Physician Assistant

## 2015-09-09 ENCOUNTER — Ambulatory Visit (INDEPENDENT_AMBULATORY_CARE_PROVIDER_SITE_OTHER): Payer: 59 | Admitting: Physician Assistant

## 2015-09-09 VITALS — BP 124/72 | HR 89 | Temp 99.1°F | Resp 16 | Ht 69.69 in | Wt 186.0 lb

## 2015-09-09 DIAGNOSIS — Z3041 Encounter for surveillance of contraceptive pills: Secondary | ICD-10-CM

## 2015-09-09 DIAGNOSIS — F329 Major depressive disorder, single episode, unspecified: Secondary | ICD-10-CM

## 2015-09-09 DIAGNOSIS — R5383 Other fatigue: Secondary | ICD-10-CM | POA: Diagnosis not present

## 2015-09-09 DIAGNOSIS — F4323 Adjustment disorder with mixed anxiety and depressed mood: Secondary | ICD-10-CM | POA: Diagnosis not present

## 2015-09-09 LAB — CBC WITH DIFFERENTIAL/PLATELET
BASOS ABS: 0 {cells}/uL (ref 0–200)
Basophils Relative: 0 %
Eosinophils Absolute: 68 cells/uL (ref 15–500)
Eosinophils Relative: 1 %
HEMATOCRIT: 42.2 % (ref 35.0–45.0)
Hemoglobin: 14.1 g/dL (ref 11.7–15.5)
LYMPHS PCT: 26 %
Lymphs Abs: 1768 cells/uL (ref 850–3900)
MCH: 28.6 pg (ref 27.0–33.0)
MCHC: 33.4 g/dL (ref 32.0–36.0)
MCV: 85.6 fL (ref 80.0–100.0)
MONO ABS: 476 {cells}/uL (ref 200–950)
MPV: 12.2 fL (ref 7.5–12.5)
Monocytes Relative: 7 %
NEUTROS PCT: 66 %
Neutro Abs: 4488 cells/uL (ref 1500–7800)
Platelets: 183 10*3/uL (ref 140–400)
RBC: 4.93 MIL/uL (ref 3.80–5.10)
RDW: 13.6 % (ref 11.0–15.0)
WBC: 6.8 10*3/uL (ref 3.8–10.8)

## 2015-09-09 LAB — TSH: TSH: 1.13 mIU/L

## 2015-09-09 MED ORDER — ESCITALOPRAM OXALATE 10 MG PO TABS: 10.0000 mg | ORAL_TABLET | Freq: Every day | ORAL | Status: DC

## 2015-09-09 MED ORDER — BUPROPION HCL ER (XL) 150 MG PO TB24: 450.0000 mg | ORAL_TABLET | Freq: Every day | ORAL | Status: DC

## 2015-09-09 MED ORDER — TRAZODONE HCL 50 MG PO TABS: 25.0000 mg | ORAL_TABLET | Freq: Every evening | ORAL | Status: DC | PRN

## 2015-09-09 MED ORDER — NORGESTREL-ETHINYL ESTRADIOL 0.3-30 MG-MCG PO TABS: 1.0000 | ORAL_TABLET | Freq: Every day | ORAL | Status: DC

## 2015-09-09 MED FILL — ESCITALOPRAM 10 MG TABLET: 10 | 30 days supply | Qty: 30 | Fill #0

## 2015-09-09 NOTE — Progress Notes (Signed)
Jennifer Whitaker  MRN: [id number] DOB: 11/27/1995  Subjective:  Pt presents to clinic because she has not energy for the last several months and when talking with her therapist they felt like she might want to have some blood work done to see if there is a metabolic cause of her fatigue.  She rests about 9 hours a night and she wakes up rested but she has no energy.  She forces herself to go to work because she has bills to pay but she really does not have energy or interest in doing things outside of work like housework - she will do things with her friends but she has to talk herself into doing these things.  She is frustrated with her weight and the fact that she is eating healthy and exercising - she knows she has mental weight issues and body image issues from many causes (mom needed bariatric and brother lost a large amount of weight and she has not been able to).  She notices that her mood is really bad when she is off her OCP the 2-4 days prior to her menses - some of her symptoms mildly start the last 3 days of her pack but they are tolerable - she has heavy menses with cramping and that makes her grouchy also.  Pt has noticed gaining weight even with eating good and exercising at least 2x/week - she has an active job 32h a week.  Klonopin once a month - her anxiety is really well controlled  Review of Systems  Constitutional: Positive for fatigue.  Psychiatric/Behavioral: Positive for dysphoric mood.    Patient Active Problem List   Diagnosis Date Noted  . Heartburn 04/04/2015  . Insomnia 12/15/2013  . Acne 09/19/2013  . Adjustment disorder with mixed anxiety and depressed mood 05/29/2013  . Reaction to severe stress 05/29/2013  . HSV (herpes simplex virus) infection 11/26/2012    Current Outpatient Prescriptions on File Prior to Visit  Medication Sig Dispense Refill  . clonazePAM (KLONOPIN) 0.5 MG tablet TAKE 1 TABLET BY MOUTH 3 TIMES DAILY AS NEEDED FOR ANXIETY 10 tablet 2   . FLUoxetine (PROZAC) 20 MG capsule Take 1 capsule (20 mg total) by mouth daily. 90 capsule 1  . pantoprazole (PROTONIX) 40 MG tablet TAKE 1 TABLET BY MOUTH ONCE DAILY 90 tablet 1  . valACYclovir (VALTREX) 1000 MG tablet Take 1 tablet (1,000 mg total) by mouth daily. (Patient taking differently: Take 1,000 mg by mouth daily as needed. ) 90 tablet 3  . Clobetasol Propionate (CLOBEX) 0.05 % shampoo Apply 1 application topically daily. (Patient not taking: Reported on 09/09/2015) 118 mL 0   No current facility-administered medications on file prior to visit.    No Known Allergies  Objective:  BP 124/72 mmHg  Pulse 89  Temp(Src) 99.1 F (37.3 C)  Resp 16  Ht 5' 9.69" (1.77 m)  Wt 186 lb (84.369 kg)  BMI 26.93 kg/m2  SpO2 98%  LMP 09/02/2015 (Approximate)  Physical Exam  Constitutional: She is oriented to person, place, and time and well-developed, well-nourished, and in no distress.  HENT:  Head: Normocephalic and atraumatic.  Right Ear: Hearing and external ear normal.  Left Ear: Hearing and external ear normal.  Eyes: Conjunctivae are normal.  Neck: Normal range of motion. No thyroid mass and no thyromegaly present.  Cardiovascular: Normal rate, regular rhythm and normal heart sounds.   No murmur heard. Pulmonary/Chest: Effort normal and breath sounds normal. She has no wheezes.  Neurological: She is alert and oriented to person, place, and time. Gait normal.  Skin: Skin is warm and dry.  Psychiatric: Mood, memory and judgment normal. She has a flat affect.  Tearful when she asks about her weight  Vitals reviewed.  Spent 35 mins with patient with >50% spent in counseling  Assessment and Plan :  Fatigue due to depression - Plan: COMPLETE METABOLIC PANEL WITH GFR, CBC with Differential/Platelet, TSH, VITAMIN D 25 Hydroxy (Vit-D Deficiency, Fractures)  Adjustment disorder with mixed anxiety and depressed mood - Plan: escitalopram (LEXAPRO) 10 MG tablet, buPROPion (WELLBUTRIN  XL) 150 MG 24 hr tablet, traZODone (DESYREL) 50 MG tablet  Encounter for surveillance of contraceptive pills - Plan: norgestrel-ethinyl estradiol (ELINEST) 0.3-30 MG-MCG tablet   Suspect this fatigue and low energy and low motivation to be related to her depression but we will do labs to r/o metabolic causes of fatigue and low energy.  She has been on Prozac for 10 years and she found the most help with her sad depression with the wellbutrin so we are going to try Lexapro to see if we can control the depression better and maybe help with her weight - though we did discuss that she is only slightly above a normal BMI for her height (though she has had some weight gain recently).  We will also put her on continuous cycle OCP to help with premenses worsening depression as well as continued bad cramping - we will start with 9 weeks or continuous active pills and then if it helps we can increase further.  She will recheck with me in 6 weeks.  She agrees and understands the plan.  Benny LennertSarah Catrina Fellenz PA-C  [facility] 09/09/2015 6:04 PM

## 2015-09-09 NOTE — Patient Instructions (Signed)
     IF you received an x-ray today, you will receive an invoice from [facility]. Please contact  Radiology at [phone] with questions or concerns regarding your invoice.   IF you received labwork today, you will receive an invoice from [facility]. Please contact [facility] at [phone] with questions or concerns regarding your invoice.   Our billing staff will not be able to assist you with questions regarding bills from these companies.  You will be contacted with the lab results as soon as they are available. The fastest way to get your results is to activate your My Chart account. Instructions are located on the last page of this paperwork. If you have not heard from us regarding the results in 2 weeks, please contact this office.      

## 2015-09-10 ENCOUNTER — Encounter: Payer: Self-pay | Admitting: Physician Assistant

## 2015-09-10 LAB — COMPLETE METABOLIC PANEL WITH GFR
ALBUMIN: 4.4 g/dL (ref 3.6–5.1)
ALT: 14 U/L (ref 6–29)
AST: 16 U/L (ref 10–30)
Alkaline Phosphatase: 55 U/L (ref 33–115)
BUN: 13 mg/dL (ref 7–25)
CALCIUM: 9.3 mg/dL (ref 8.6–10.2)
CHLORIDE: 104 mmol/L (ref 98–110)
CO2: 20 mmol/L (ref 20–31)
CREATININE: 0.93 mg/dL (ref 0.50–1.10)
GFR, Est African American: >89 mL/min (ref >=60)
GFR, Est Non African American: 88 mL/min (ref >=60)
Glucose, Bld: 86 mg/dL (ref 65–99)
Potassium: 3.8 mmol/L (ref 3.5–5.3)
Sodium: 138 mmol/L (ref 135–146)
Total Bilirubin: 0.4 mg/dL (ref 0.2–1.2)
Total Protein: 6.7 g/dL (ref 6.1–8.1)

## 2015-09-10 LAB — VITAMIN D 25 HYDROXY (VIT D DEFICIENCY, FRACTURES): VIT D 25 HYDROXY: 29 ng/mL — AB (ref 30–100)

## 2015-09-24 ENCOUNTER — Ambulatory Visit (INDEPENDENT_AMBULATORY_CARE_PROVIDER_SITE_OTHER): Payer: 59 | Admitting: Emergency Medicine

## 2015-09-24 ENCOUNTER — Ambulatory Visit (INDEPENDENT_AMBULATORY_CARE_PROVIDER_SITE_OTHER): Payer: 59

## 2015-09-24 VITALS — BP 114/76 | HR 86 | Temp 98.6°F | Resp 18 | Ht 69.0 in | Wt 185.0 lb

## 2015-09-24 DIAGNOSIS — M25571 Pain in right ankle and joints of right foot: Secondary | ICD-10-CM | POA: Diagnosis not present

## 2015-09-24 DIAGNOSIS — M79671 Pain in right foot: Secondary | ICD-10-CM | POA: Diagnosis not present

## 2015-09-24 NOTE — Progress Notes (Signed)
Patient ID: Jennifer Whitaker, female   DOB: 1995-05-19, 20 y.o.   MRN: [id number]    By signing my name below I, Shelah Lewandowsky, attest that this documentation has been prepared under the direction and in the presence of Lesle Chris, MD. Electonically Signed. Shelah Lewandowsky, Scribe 09/24/2015 at 6:00 PM   Chief Complaint:  Chief Complaint  Patient presents with  . Foot Injury    While working out last night     HPI: Jennifer Whitaker is a 20 y.o. female who reports to [facility] today complaining of rt foot pain that started after she was working out yesterday. Pt states that she landed on her foot wrong. Pain yesterday was excruciating. Pain has improved today. Pt has been working and walking on her foot all day today. Pt denies any changes in her footwear.   Pt also reports a mild abrasion on her rt foot from scratching a mosquito bite.    Pt works out twice a week for approximately an hour.  Pt denies any chance of pregnancy.    Past Medical History:  Diagnosis Date  . Acne   . Anxiety   . Depression    Past Surgical History:  Procedure Laterality Date  . WISDOM TOOTH EXTRACTION Bilateral    Social History   Social History  . Marital status: Single    Spouse name: n/a  . Number of children: 0  . Years of education: 12+   Occupational History  . waitress Student    [employer]   Social History Main Topics  . Smoking status: Never Smoker  . Smokeless tobacco: Never Used  . Alcohol use No  . Drug use: No  . Sexual activity: No   Other Topics Concern  . Not on file   Social History Narrative   Lives with her boyfriend.  Mother lives nearby. Her father lives nearby. Her brother lives nearby.   Family History  Problem Relation Age of Onset  . Cancer Paternal Grandfather    No Known Allergies Prior to Admission medications   Medication Sig Start Date End Date Taking? Authorizing Provider  buPROPion (WELLBUTRIN XL) 150 MG 24 hr tablet Take 3 tablets (450 mg  total) by mouth daily. 09/09/15  Yes Sarah Harvie Bridge, PA-C  clonazePAM (KLONOPIN) 0.5 MG tablet TAKE 1 TABLET BY MOUTH 3 TIMES DAILY AS NEEDED FOR ANXIETY 01/30/15  Yes Tonye Pearson, MD  escitalopram (LEXAPRO) 10 MG tablet Take 1 tablet (10 mg total) by mouth daily. 09/09/15  Yes Morrell Riddle, PA-C  norgestrel-ethinyl estradiol (ELINEST) 0.3-30 MG-MCG tablet Take 1 tablet by mouth daily. 09/09/15  Yes Sarah Harvie Bridge, PA-C  pantoprazole (PROTONIX) 40 MG tablet TAKE 1 TABLET BY MOUTH ONCE DAILY 04/04/15  Yes Morrell Riddle, PA-C  traZODone (DESYREL) 50 MG tablet Take 0.5-1 tablets (25-50 mg total) by mouth at bedtime as needed for sleep. 09/09/15  Yes Morrell Riddle, PA-C  valACYclovir (VALTREX) 1000 MG tablet Take 1 tablet (1,000 mg total) by mouth daily. Patient taking differently: Take 1,000 mg by mouth daily as needed.  11/26/12  Yes Sherren Mocha, MD  Clobetasol Propionate (CLOBEX) 0.05 % shampoo Apply 1 application topically daily. Patient not taking: Reported on 09/09/2015 04/06/15   Morrell Riddle, PA-C  FLUoxetine (PROZAC) 20 MG capsule Take 1 capsule (20 mg total) by mouth daily. Patient not taking: Reported on 09/24/2015 04/04/15   Morrell Riddle, PA-C     ROS: The patient denies fevers,  chills, night sweats, unintentional weight loss, chest pain, palpitations, wheezing, dyspnea on exertion, nausea, vomiting, abdominal pain, dysuria, hematuria, melena, numbness, weakness, or tingling. Pt is positive for rt foot pain. Pt is positive for minor abrasion on rt foot.   All other systems have been reviewed and were otherwise negative with the exception of those mentioned in the HPI and as above.    PHYSICAL EXAM: Vitals:   09/24/15 1717  BP: 114/76  Pulse: 86  Resp: 18  Temp: 98.6 F (37 C)   Body mass index is 27.32 kg/m.   General: Alert, no acute distress HEENT:  Normocephalic, atraumatic, oropharynx patent. Eye: Nonie Hoyer [facility] Cardiovascular:  Regular rate and rhythm, no rubs murmurs or  gallops.  No Carotid bruits, radial pulse intact. No pedal edema.  Respiratory: Clear to auscultation bilaterally.  No wheezes, rales, or rhonchi.  No cyanosis, no use of accessory musculature Abdominal: No organomegaly, abdomen is soft and non-tender, positive bowel sounds.  No masses. Musculoskeletal: Gait intact. No edema. Minimal tenderness over second metatarsal of her rt foot.  Skin: No rashes. Pt has a minor 1cm excoriated area on her distal lateral rt foot.  Neurologic: Facial musculature symmetric. Psychiatric: Patient acts appropriately throughout our interaction. Lymphatic: No cervical or submandibular lymphadenopathy    LABS:    EKG/XRAY:   Primary read interpreted by Dr. Cleta Alberts at [facility]. Dg Foot Complete Right  Result Date: 09/24/2015 CLINICAL DATA:  complaining of rt foot pain that started after she was working out yesterday. Pt states that she landed on her foot wrong. EXAM: RIGHT FOOT COMPLETE - 3+ VIEW COMPARISON:  None. FINDINGS: There is no evidence of fracture or dislocation. There is no evidence of arthropathy or other focal bone abnormality. Soft tissues are unremarkable. IMPRESSION: Negative. Electronically Signed   By: Amie Portland M.D.   On: 09/24/2015 18:12   ASSESSMENT/PLAN: X-ray unremarkable. She could have a stress fracture. She was placed in a boot hopefully this will give her some relief.. She will continue ibuprofen.I personally performed the services described in this documentation, which was scribed in my presence. The recorded information has been reviewed and is accurate.    Gross sideeffects, risk and benefits, and alternatives of medications d/w patient. Patient is aware that all medications have potential sideeffects and we are unable to predict every sideeffect or drug-drug interaction that may occur.  Lesle Chris MD 09/24/2015 5:31 PM

## 2015-09-24 NOTE — Patient Instructions (Signed)
     IF you received an x-ray today, you will receive an invoice from [facility]. Please contact [facility] at [phone] with questions or concerns regarding your invoice.   IF you received labwork today, you will receive an invoice from [facility]. Please contact [facility] at [phone] with questions or concerns regarding your invoice.   Our billing staff will not be able to assist you with questions regarding bills from these companies.  You will be contacted with the lab results as soon as they are available. The fastest way to get your results is to activate your My Chart account. Instructions are located on the last page of this paperwork. If you have not heard from us regarding the results in 2 weeks, please contact this office.      

## 2015-10-02 DIAGNOSIS — F331 Major depressive disorder, recurrent, moderate: Secondary | ICD-10-CM | POA: Diagnosis not present

## 2015-10-08 ENCOUNTER — Ambulatory Visit: Payer: 59

## 2015-10-08 NOTE — Patient Instructions (Signed)
     IF you received an x-ray today, you will receive an invoice from  Radiology. Please contact  Radiology at [phone] with questions or concerns regarding your invoice.   IF you received labwork today, you will receive an invoice from [facility]. Please contact [facility] at [phone] with questions or concerns regarding your invoice.   Our billing staff will not be able to assist you with questions regarding bills from these companies.  You will be contacted with the lab results as soon as they are available. The fastest way to get your results is to activate your My Chart account. Instructions are located on the last page of this paperwork. If you have not heard from us regarding the results in 2 weeks, please contact this office.      

## 2015-10-09 MED FILL — ESCITALOPRAM 10 MG TABLET: 10 | 30 days supply | Qty: 30 | Fill #1

## 2015-10-13 ENCOUNTER — Ambulatory Visit (INDEPENDENT_AMBULATORY_CARE_PROVIDER_SITE_OTHER): Payer: 59 | Admitting: Physician Assistant

## 2015-10-13 VITALS — BP 134/86 | HR 78 | Temp 98.6°F | Resp 16 | Ht 69.0 in | Wt 186.0 lb

## 2015-10-13 DIAGNOSIS — R5383 Other fatigue: Secondary | ICD-10-CM

## 2015-10-13 DIAGNOSIS — F4323 Adjustment disorder with mixed anxiety and depressed mood: Secondary | ICD-10-CM | POA: Diagnosis not present

## 2015-10-13 DIAGNOSIS — F32A Depression, unspecified: Secondary | ICD-10-CM

## 2015-10-13 DIAGNOSIS — F329 Major depressive disorder, single episode, unspecified: Secondary | ICD-10-CM

## 2015-10-13 DIAGNOSIS — F322 Major depressive disorder, single episode, severe without psychotic features: Secondary | ICD-10-CM | POA: Diagnosis not present

## 2015-10-13 MED ORDER — DESVENLAFAXINE SUCCINATE ER 25 MG PO TB24: 25.0000 mg | ORAL_TABLET | Freq: Every day | ORAL | 0 refills | Status: DC

## 2015-10-13 NOTE — Progress Notes (Signed)
Jennifer Whitaker  MRN: [id number] DOB: [birth year]  Subjective:  Pt presents to clinic for medication adjustment - she switched from Prozac to Lexapro and she has felt like her depression has gotten worse since then - she has been more sad and down compared to before -- yesterday she had feelings that if she was no longer here it would be ok - she has no suicidal thoughts or plans but is tired of feeling like this.  She has continued to go to therapy.  [facility]   Review of Systems  Psychiatric/Behavioral: Positive for dysphoric mood. Negative for sleep disturbance and suicidal ideas. The patient is not nervous/anxious.     Patient Active Problem List   Diagnosis Date Noted  . Heartburn 04/04/2015  . Insomnia 12/15/2013  . Acne 09/19/2013  . Adjustment disorder with mixed anxiety and depressed mood 05/29/2013  . Reaction to severe stress 05/29/2013  . HSV (herpes simplex virus) infection 11/26/2012    Current Outpatient Prescriptions on File Prior to Visit  Medication Sig Dispense Refill  . buPROPion (WELLBUTRIN XL) 150 MG 24 hr tablet Take 3 tablets (450 mg total) by mouth daily. 270 tablet 1  . Clobetasol Propionate (CLOBEX) 0.05 % shampoo Apply 1 application topically daily. 118 mL 0  . clonazePAM (KLONOPIN) 0.5 MG tablet TAKE 1 TABLET BY MOUTH 3 TIMES DAILY AS NEEDED FOR ANXIETY 10 tablet 2  . escitalopram (LEXAPRO) 10 MG tablet Take 1 tablet (10 mg total) by mouth daily. 30 tablet 1  . norgestrel-ethinyl estradiol (ELINEST) 0.3-30 MG-MCG tablet Take 1 tablet by mouth daily. 74 tablet 4  . pantoprazole (PROTONIX) 40 MG tablet TAKE 1 TABLET BY MOUTH ONCE DAILY 90 tablet 1  . traZODone (DESYREL) 50 MG tablet Take 0.5-1 tablets (25-50 mg total) by mouth at bedtime as needed for sleep. 90 tablet 1  . valACYclovir (VALTREX) 1000 MG tablet Take 1 tablet (1,000 mg total) by mouth daily. (Patient taking differently: Take 1,000 mg by mouth daily as needed. ) 90 tablet 3  .  FLUoxetine (PROZAC) 20 MG capsule Take 1 capsule (20 mg total) by mouth daily. (Patient not taking: Reported on 09/24/2015) 90 capsule 1   No current facility-administered medications on file prior to visit.     No Known Allergies  Pt patients past, family and social history were reviewed and updated.  Objective:  BP 134/86 (BP Location: Right Arm, Patient Position: Sitting, Cuff Size: Normal)   Pulse 78   Temp 98.6 F (37 C)   Resp 16   Ht 5\' 9"  (1.753 m)   Wt 186 lb (84.4 kg)   SpO2 98%   BMI 27.47 kg/m   Physical Exam  Constitutional: She is oriented to person, place, and time and well-developed, well-nourished, and in no distress.  HENT:  Head: Normocephalic and atraumatic.  Right Ear: Hearing and external ear normal.  Left Ear: Hearing and external ear normal.  Eyes: Conjunctivae are normal.  Neck: Normal range of motion.  Pulmonary/Chest: Effort normal.  Neurological: She is alert and oriented to person, place, and time. Gait normal.  Skin: Skin is warm and dry.  Psychiatric: Mood, memory and judgment normal. She has a flat affect.  Vitals reviewed.   Assessment and Plan :  Adjustment disorder with mixed anxiety and depressed mood - Plan: Desvenlafaxine Succinate ER (PRISTIQ) 25 MG TB24  Fatigue due to depression   Titrate the lexapro down for the next 2 weeks as the increase in Pristiq is happening -  she will contact me in 3 weeks with her progress - we discussed possibility abilify but we will try pristiq 1st  Benny LennertSarah Aishah Teffeteller PA-C  [facility] 10/13/2015 7:30 PM

## 2015-10-13 NOTE — Patient Instructions (Addendum)
  Start the pristiq - 25mg  every day for 2 weeks - at the end of the 2 weeks if you are tolerating good increase to 50mg  (2 pills a day)  For the next week take Lexapro 10mg , for the 2nd week take 5mg  (1/2 pill) daily - as long as you are feeling fine you can stop that - if you are a little nauseated you can take 1/2 pill every other day for a week or so  Contact me through  mychart in about 3 weeks -   IF you received an x-ray today, you will receive an invoice [facility]. Please contact [facility] at [phone] with questions or concerns regarding your invoice.   IF you received labwork today, you will receive an invoice from [facility]. Please contact [facility] at [phone] with questions or concerns regarding your invoice.   Our billing staff will not be able to assist you with questions regarding bills from these companies.  You will be contacted with the lab results as soon as they are available. The fastest way to get your results is to activate your My Chart account. Instructions are located on the last page of this paperwork. If you have not heard from us regarding the results in 2 weeks, please contact this office.

## 2015-10-14 MED FILL — DESVENLAFAXINE SUC ER 25 MG: 25 | 30 days supply | Qty: 30 | Fill #0

## 2015-10-15 MED FILL — ELINEST-28 TABLET: 0.3-30 | 84 days supply | Qty: 84 | Fill #2

## 2015-11-02 MED FILL — BUPROPION HCL XL 150 MG TAB: 150 | 90 days supply | Qty: 270 | Fill #1

## 2015-11-06 DIAGNOSIS — F331 Major depressive disorder, recurrent, moderate: Secondary | ICD-10-CM | POA: Diagnosis not present

## 2015-11-13 ENCOUNTER — Other Ambulatory Visit: Payer: Self-pay | Admitting: Physician Assistant

## 2015-11-13 DIAGNOSIS — F4323 Adjustment disorder with mixed anxiety and depressed mood: Secondary | ICD-10-CM

## 2015-11-13 MED ORDER — DESVENLAFAXINE SUCCINATE ER 50 MG PO TB24
50.0000 mg | ORAL_TABLET | Freq: Every day | ORAL | 1 refills | Status: DC
Start: 2015-11-13 — End: 2015-12-15

## 2015-11-13 MED FILL — DESVENLAFAXINE SUC ER 50 MG: 50 | 30 days supply | Qty: 30 | Fill #0

## 2015-11-16 ENCOUNTER — Ambulatory Visit (INDEPENDENT_AMBULATORY_CARE_PROVIDER_SITE_OTHER): Payer: 59 | Admitting: Family Medicine

## 2015-11-16 VITALS — BP 116/70 | HR 112 | Temp 99.3°F | Resp 18 | Ht 70.0 in | Wt 181.6 lb

## 2015-11-16 DIAGNOSIS — A084 Viral intestinal infection, unspecified: Secondary | ICD-10-CM | POA: Diagnosis not present

## 2015-11-16 DIAGNOSIS — R112 Nausea with vomiting, unspecified: Secondary | ICD-10-CM | POA: Diagnosis not present

## 2015-11-16 MED ORDER — ONDANSETRON 4 MG PO TBDP
4.0000 mg | ORAL_TABLET | Freq: Once | ORAL | Status: AC
  Administered 2015-11-16: 4 mg via ORAL

## 2015-11-16 MED ORDER — ONDANSETRON 4 MG PO TBDP: ORAL_TABLET | ORAL | 0 refills | Status: DC

## 2015-11-16 MED FILL — ONDANSETRON ODT 4 MG TABLET: 4 | 3 days supply | Qty: 12 | Fill #0

## 2015-11-16 NOTE — Patient Instructions (Addendum)
Drink plenty of fluids to stay well hydrated (nonalcoholic, non-dairy)  In the event of acute worsening as discussed either return or go to the emergency room  Stay out of work at least through tomorrow. If you're doing better he can return on Wednesday.    IF you received an x-ray today, you will receive an invoice from [facility]. Please contact [facility] at [phone] with questions or concerns regarding your invoice.   IF you received labwork today, you will receive an invoice from [facility]. Please contact [facility] at [phone] with questions or concerns regarding your invoice.   Our billing staff will not be able to assist you with questions regarding bills from these companies.  You will be contacted with the lab results as soon as they are available. The fastest way to get your results is to activate your My Chart account. Instructions are located on the last page of this paperwork. If you have not heard from us regarding the results in 2 weeks, please contact this office.

## 2015-11-16 NOTE — Progress Notes (Signed)
Patient ID: Jennifer SauceJulianne M Littrell, female    DOB: 1995/05/04  Age: 20 y.o. MRN: [id number]  Chief Complaint  Patient presents with  . Nausea    vomiting, headache, chills, and bodyache since yesterday after hiking     Subjective:   Patient was out walking a 4 mile trail yesterday and developed nausea with vomiting. She has vomited numerous times in the night and this morning. She has been taking some liquids. She had a bowel movement this morning, no diarrhea. She has some generalized abdominal pains. She does not have a history of having frequent illnesses such as this. Her last menstrual cycle was last week of August and she denies chance of pregnancy. She had a fever and chills. She did not have a thermometer at home. No urinary symptoms. No respiratory symptoms.  Current allergies, medications, problem list, past/family and social histories reviewed.  Objective:  BP 116/70 (BP Location: Right Arm, Patient Position: Sitting, Cuff Size: Small)   Pulse (!) 112   Temp 99.3 F (37.4 C) (Oral)   Resp 18   Ht 5\' 10"  (1.778 m)   Wt 181 lb 9.6 oz (82.4 kg)   LMP 10/19/2015   SpO2 99%   BMI 26.06 kg/m   Looks like she doesn't feel well. Throat clear. Neck supple without nodes. Chest clear. Heart regular without murmur. Abdomen has bowel sounds present, soft, no specific masses or tenderness. No rebound.  Assessment & Plan:   Assessment: 1. Nausea and vomiting, intractability of vomiting not specified, unspecified vomiting type   2. Viral gastroenteritis       Plan: This is consistent with a viral gastroenteritis and we will treat symptomatically accordingly. If she gets worse she is to return.  No orders of the defined types were placed in this encounter.   Meds ordered this encounter  Medications  . ondansetron (ZOFRAN ODT) 4 MG disintegrating tablet    Sig: Take one every 6 hours as needed    Dispense:  12 tablet    Refill:  0  . ondansetron (ZOFRAN-ODT) disintegrating tablet  4 mg         Patient Instructions   Drink plenty of fluids to stay well hydrated (nonalcoholic, non-dairy)  In the event of acute worsening as discussed either return or go to the emergency room  Stay out of work at least through tomorrow. If you're doing better he can return on Wednesday.    IF you received an x-ray today, you will receive an invoice from [facility]. Please contact [facility] at [phone] with questions or concerns regarding your invoice.   IF you received labwork today, you will receive an invoice from [facility]. Please contact [facility] at [phone] with questions or concerns regarding your invoice.   Our billing staff will not be able to assist you with questions regarding bills from these companies.  You will be contacted with the lab results as soon as they are available. The fastest way to get your results is to activate your My Chart account. Instructions are located on the last page of this paperwork. If you have not heard from us regarding the results in 2 weeks, please contact this office.         No Follow-up on file.   Vyctoria Dickman, MD 11/16/2015

## 2015-12-11 DIAGNOSIS — F331 Major depressive disorder, recurrent, moderate: Secondary | ICD-10-CM | POA: Diagnosis not present

## 2015-12-15 ENCOUNTER — Ambulatory Visit (INDEPENDENT_AMBULATORY_CARE_PROVIDER_SITE_OTHER): Payer: 59 | Admitting: Physician Assistant

## 2015-12-15 VITALS — BP 110/66 | HR 81 | Temp 98.2°F | Resp 16 | Ht 69.75 in | Wt 182.8 lb

## 2015-12-15 DIAGNOSIS — F329 Major depressive disorder, single episode, unspecified: Secondary | ICD-10-CM

## 2015-12-15 DIAGNOSIS — E663 Overweight: Secondary | ICD-10-CM | POA: Diagnosis not present

## 2015-12-15 DIAGNOSIS — Z3041 Encounter for surveillance of contraceptive pills: Secondary | ICD-10-CM | POA: Diagnosis not present

## 2015-12-15 DIAGNOSIS — F4323 Adjustment disorder with mixed anxiety and depressed mood: Secondary | ICD-10-CM | POA: Diagnosis not present

## 2015-12-15 DIAGNOSIS — F32A Depression, unspecified: Secondary | ICD-10-CM

## 2015-12-15 DIAGNOSIS — R5383 Other fatigue: Secondary | ICD-10-CM

## 2015-12-15 MED ORDER — BUPROPION HCL ER (XL) 150 MG PO TB24: 450.0000 mg | ORAL_TABLET | Freq: Every day | ORAL | 1 refills | Status: DC

## 2015-12-15 MED ORDER — DESVENLAFAXINE SUCCINATE ER 100 MG PO TB24: 100.0000 mg | ORAL_TABLET | Freq: Every day | ORAL | 0 refills | Status: DC

## 2015-12-15 MED ORDER — NORGESTREL-ETHINYL ESTRADIOL 0.3-30 MG-MCG PO TABS: 1.0000 | ORAL_TABLET | Freq: Every day | ORAL | 3 refills | Status: DC

## 2015-12-15 NOTE — Progress Notes (Signed)
Jennifer SauceJulianne M Whitaker  MRN: [id number] DOB: Feb 16, 1996  Subjective:  Pt presents to clinic for medication review.  Since her last visit she has stopped the lexparo and does not feel worse - she has started the Pristiq and when she first started it she got nauseated but that has passed (she takes at night).  She really does not feel much different maybe a little improvement in her energy but no change in her depressed mood.  Fatigue is still bad - esp at work - really not interested in being there but knows she has to stay there because she needs the money and cannot make it anywhere else and she needs to finish school - she knows that is going to take a while - she really wants to write but knows she cannot make enough money to live on - she is writing a book currently but she just cannot seem to finish it or even work on it.  No current intent or plans of hurting herself - she has thoughts about how it would be easier --   Jennifer Whitaker -- at [facility]  Pt has long standing issue with weight as she was told and believes that her father left her mother because of her weight  Review of Systems  Psychiatric/Behavioral: Positive for dysphoric mood. Negative for self-injury, sleep disturbance and suicidal ideas. The patient is not nervous/anxious.     Patient Active Problem List   Diagnosis Date Noted  . Heartburn 04/04/2015  . Insomnia 12/15/2013  . Acne 09/19/2013  . Major depression (HCC) 05/29/2013  . Reaction to severe stress 05/29/2013  . HSV (herpes simplex virus) infection 11/26/2012    Current Outpatient Prescriptions on File Prior to Visit  Medication Sig Dispense Refill  . Clobetasol Propionate (CLOBEX) 0.05 % shampoo Apply 1 application topically daily. 118 mL 0  . clonazePAM (KLONOPIN) 0.5 MG tablet TAKE 1 TABLET BY MOUTH 3 TIMES DAILY AS NEEDED FOR ANXIETY 10 tablet 2  . ondansetron (ZOFRAN ODT) 4 MG disintegrating tablet Take one every 6 hours as needed 12 tablet 0  .  pantoprazole (PROTONIX) 40 MG tablet TAKE 1 TABLET BY MOUTH ONCE DAILY 90 tablet 1  . traZODone (DESYREL) 50 MG tablet Take 0.5-1 tablets (25-50 mg total) by mouth at bedtime as needed for sleep. 90 tablet 1   No current facility-administered medications on file prior to visit.     No Known Allergies  Pt patients past, family and social history were reviewed and updated.  Objective:  BP 110/66 (BP Location: Right Arm, Patient Position: Sitting, Cuff Size: Normal)   Pulse 81   Temp 98.2 F (36.8 C) (Oral)   Resp 16   Ht 5' 9.75" (1.772 m)   Wt 182 lb 12.8 oz (82.9 kg)   LMP 10/19/2015   SpO2 98%   BMI 26.42 kg/m   Physical Exam  Constitutional: She is oriented to person, place, and time and well-developed, well-nourished, and in no distress.  HENT:  Head: Normocephalic and atraumatic.  Right Ear: Hearing and external ear normal.  Left Ear: Hearing and external ear normal.  Eyes: Conjunctivae are normal.  Neck: Normal range of motion.  Pulmonary/Chest: Effort normal.  Neurological: She is alert and oriented to person, place, and time. Gait normal.  Skin: Skin is warm and dry.  Psychiatric: Mood, memory, affect and judgment normal.  Flat affect  Vitals reviewed.   Assessment and Plan :  Adjustment disorder with mixed anxiety and depressed mood -  Plan: buPROPion (WELLBUTRIN XL) 150 MG 24 hr tablet, desvenlafaxine (PRISTIQ) 100 MG 24 hr tablet - continue current medications but will increase the dose of the Pristiq to 100mg  - she will do this for 8 weeks and then we will monitor her response - ? Mood stabilizer but with the side effects of weight gain I am concerned about this because the patient is really upset about her weight - ? Role of attention and her lack of focus on what matters - ? Adderall being of help to the patient to give her to ability to focus on what she needs to focus on verses things that she cannot change - once her focus changes than maybe her depression will  improve because she will be successful with some things  Encounter for surveillance of contraceptive pills - Plan: norgestrel-ethinyl estradiol Derinda Sis) 0.3-30 MG-MCG tablet  Benny Lennert PA-C  [facility] 12/16/2015 12:43 PM

## 2015-12-15 NOTE — Patient Instructions (Signed)
     IF you received an x-ray today, you will receive an invoice from [facility]. Please contact [facility] at [phone] with questions or concerns regarding your invoice.   IF you received labwork today, you will receive an invoice from [facility]. Please contact [facility] at [phone] with questions or concerns regarding your invoice.   Our billing staff will not be able to assist you with questions regarding bills from these companies.  You will be contacted with the lab results as soon as they are available. The fastest way to get your results is to activate your My Chart account. Instructions are located on the last page of this paperwork. If you have not heard from us regarding the results in 2 weeks, please contact this office.      

## 2015-12-16 MED FILL — ELINEST-28 TABLET: 0.3-30 | 84 days supply | Qty: 112 | Fill #0 | Status: TO

## 2015-12-16 MED FILL — DESVENLAFAXINE SUC ER 100 M: 100 | 90 days supply | Qty: 90 | Fill #0

## 2015-12-18 ENCOUNTER — Encounter: Payer: Self-pay | Admitting: Physician Assistant

## 2015-12-18 ENCOUNTER — Telehealth: Payer: Self-pay | Admitting: Physician Assistant

## 2015-12-18 NOTE — Telephone Encounter (Signed)
Spoke with Gwenn at [facility] - who is the patient's therapist - pt is unfocused during therapy - spoke with Gwenn about maybe Adderall being helpful to the patient because she seems to lack focus on anything and she has such a low motivation low energy depression I wonder if the stimulation would help to allow her to get on track.  She has a meeting with the patient in a week or so and she will communicate back with me the results.

## 2015-12-22 ENCOUNTER — Ambulatory Visit: Payer: 59

## 2015-12-24 ENCOUNTER — Telehealth: Payer: Self-pay

## 2015-12-24 NOTE — Telephone Encounter (Signed)
Last seen 11/2015 and last refill 01/30/15 #10 with 2 refills

## 2015-12-28 NOTE — Telephone Encounter (Signed)
What medication needs to be refilled? 

## 2015-12-29 ENCOUNTER — Ambulatory Visit (INDEPENDENT_AMBULATORY_CARE_PROVIDER_SITE_OTHER): Payer: 59 | Admitting: Physician Assistant

## 2015-12-29 VITALS — BP 118/78 | HR 83 | Temp 98.4°F | Resp 16 | Wt 184.2 lb

## 2015-12-29 DIAGNOSIS — F5105 Insomnia due to other mental disorder: Secondary | ICD-10-CM | POA: Diagnosis not present

## 2015-12-29 DIAGNOSIS — F322 Major depressive disorder, single episode, severe without psychotic features: Secondary | ICD-10-CM | POA: Diagnosis not present

## 2015-12-29 DIAGNOSIS — F99 Mental disorder, not otherwise specified: Secondary | ICD-10-CM | POA: Diagnosis not present

## 2015-12-29 MED ORDER — CLONAZEPAM 0.5 MG PO TABS: ORAL_TABLET | ORAL | 0 refills | Status: DC

## 2015-12-29 MED ORDER — AMPHETAMINE-DEXTROAMPHETAMINE 10 MG PO TABS: 10.0000 mg | ORAL_TABLET | Freq: Every day | ORAL | 0 refills | Status: DC

## 2015-12-29 NOTE — Progress Notes (Signed)
Jennifer Whitaker  MRN: [id number] DOB: [birth year]  Subjective:  Pt presents to clinic for recheck of her depression.  She has increased the pristiq and she has not noticed any improvement - she still has no motivation or energy.  Boyfriend is with her and states that she rarely finishes tasks that she starts.  Review of Systems  Psychiatric/Behavioral: Positive for decreased concentration and dysphoric mood. Negative for suicidal ideas. The patient is nervous/anxious.     Patient Active Problem List   Diagnosis Date Noted  . Heartburn 04/04/2015  . Insomnia 12/15/2013  . Acne 09/19/2013  . Major depression (HCC) 05/29/2013  . Reaction to severe stress 05/29/2013  . HSV (herpes simplex virus) infection 11/26/2012    Current Outpatient Prescriptions on File Prior to Visit  Medication Sig Dispense Refill  . buPROPion (WELLBUTRIN XL) 150 MG 24 hr tablet Take 3 tablets (450 mg total) by mouth daily. 270 tablet 1  . Clobetasol Propionate (CLOBEX) 0.05 % shampoo Apply 1 application topically daily. 118 mL 0  . desvenlafaxine (PRISTIQ) 100 MG 24 hr tablet Take 1 tablet (100 mg total) by mouth daily. 90 tablet 0  . norgestrel-ethinyl estradiol (ELINEST) 0.3-30 MG-MCG tablet Take 1 tablet by mouth daily. Pt to take hormone pills for 12 weeks and then use placebo pills 4 Package 3  . pantoprazole (PROTONIX) 40 MG tablet TAKE 1 TABLET BY MOUTH ONCE DAILY 90 tablet 1  . traZODone (DESYREL) 50 MG tablet Take 0.5-1 tablets (25-50 mg total) by mouth at bedtime as needed for sleep. 90 tablet 1  . ondansetron (ZOFRAN ODT) 4 MG disintegrating tablet Take one every 6 hours as needed (Patient not taking: Reported on 12/29/2015) 12 tablet 0   No current facility-administered medications on file prior to visit.     No Known Allergies  Pt patients past, family and social history were reviewed and updated.   Objective:  BP 118/78 (BP Location: Right Arm, Cuff Size: Normal)   Pulse 83   Temp 98.4  F (36.9 C) (Oral)   Resp 16   Wt 184 lb 3.2 oz (83.6 kg)   SpO2 99%   BMI 26.62 kg/m   Physical Exam  Constitutional: She is oriented to person, place, and time and well-developed, well-nourished, and in no distress.  HENT:  Head: Normocephalic and atraumatic.  Right Ear: Hearing and external ear normal.  Left Ear: Hearing and external ear normal.  Eyes: Conjunctivae are normal.  Neck: Normal range of motion.  Pulmonary/Chest: Effort normal.  Neurological: She is alert and oriented to person, place, and time. Gait normal.  Skin: Skin is warm and dry.  Psychiatric: Mood, memory, affect and judgment normal.  Vitals reviewed.   Assessment and Plan :  Severe single current episode of major depressive disorder, without psychotic features (HCC) - Plan: amphetamine-dextroamphetamine (ADDERALL) 10 MG tablet - trial of Adderall to see if increased attention and focus will improve her depression because when she is able to finish a task she might have improved self-confidence which will likely improve her depression.  Insomnia due to other mental disorder - Plan: clonazePAM (KLONOPIN) 0.5 MG tablet - continue prn usage  She will contact me through mychart with her results over the next several days - she will look for good/bad effects from the medication and how long it last - we discussed possible side effects and how to take the medication - we will start with daily dosing over the next 2 weeks to see if  there is improvement in her function at home  Benny LennertSarah Weber PA-C  [facility] 12/29/2015 6:44 PM

## 2015-12-29 NOTE — Patient Instructions (Signed)
     IF you received an x-ray today, you will receive an invoice from [facility]. Please contact [facility] at [phone] with questions or concerns regarding your invoice.   IF you received labwork today, you will receive an invoice from [facility]. Please contact [facility] at [phone] with questions or concerns regarding your invoice.   Our billing staff will not be able to assist you with questions regarding bills from these companies.  You will be contacted with the lab results as soon as they are available. The fastest way to get your results is to activate your My Chart account. Instructions are located on the last page of this paperwork. If you have not heard from us regarding the results in 2 weeks, please contact this office.      

## 2015-12-30 MED FILL — DEXTROAMP-AMPHETAMIN 10 MG: 10 | 30 days supply | Qty: 30 | Fill #0

## 2015-12-30 MED FILL — clonazePAM 0.5 MG TABS: 0.5 | 3 days supply | Qty: 10 | Fill #0

## 2016-01-04 ENCOUNTER — Encounter: Payer: Self-pay | Admitting: Physician Assistant

## 2016-01-06 DIAGNOSIS — F330 Major depressive disorder, recurrent, mild: Secondary | ICD-10-CM | POA: Diagnosis not present

## 2016-01-20 ENCOUNTER — Other Ambulatory Visit: Payer: Self-pay | Admitting: Physician Assistant

## 2016-01-20 DIAGNOSIS — F322 Major depressive disorder, single episode, severe without psychotic features: Secondary | ICD-10-CM

## 2016-01-20 DIAGNOSIS — R12 Heartburn: Secondary | ICD-10-CM

## 2016-01-21 ENCOUNTER — Encounter: Payer: Self-pay | Admitting: Physician Assistant

## 2016-01-21 MED ORDER — CLOBETASOL PROPIONATE 0.05 % EX SHAM: 1.0000 "application " | MEDICATED_SHAMPOO | Freq: Every day | CUTANEOUS | 0 refills | Status: DC

## 2016-01-21 MED ORDER — AMPHETAMINE-DEXTROAMPHETAMINE 10 MG PO TABS: 10.0000 mg | ORAL_TABLET | Freq: Every day | ORAL | 0 refills | Status: DC

## 2016-01-21 MED FILL — CLOBETASOL 0.05% SHAMPOO: 0.05 | 30 days supply | Qty: 118 | Fill #0

## 2016-01-21 MED FILL — PANTOPRAZOLE SOD DR 40 MG T: 40 | 90 days supply | Qty: 90 | Fill #0

## 2016-01-21 NOTE — Telephone Encounter (Signed)
Done ready to pick up

## 2016-01-22 ENCOUNTER — Other Ambulatory Visit: Payer: Self-pay | Admitting: Physician Assistant

## 2016-01-22 DIAGNOSIS — R12 Heartburn: Secondary | ICD-10-CM

## 2016-01-22 MED ORDER — PANTOPRAZOLE SODIUM 40 MG PO TBEC: 40.0000 mg | DELAYED_RELEASE_TABLET | Freq: Every day | ORAL | 0 refills | Status: DC

## 2016-01-22 MED ORDER — AMPHETAMINE-DEXTROAMPHETAMINE 12.5 MG PO TABS: 12.5000 mg | ORAL_TABLET | Freq: Every day | ORAL | 0 refills | Status: DC

## 2016-01-26 ENCOUNTER — Encounter: Payer: Self-pay | Admitting: Physician Assistant

## 2016-01-26 DIAGNOSIS — F4323 Adjustment disorder with mixed anxiety and depressed mood: Secondary | ICD-10-CM

## 2016-01-26 MED ORDER — TRAZODONE HCL 50 MG PO TABS: 25.0000 mg | ORAL_TABLET | Freq: Every evening | ORAL | 1 refills | Status: DC | PRN

## 2016-01-26 MED ORDER — BUPROPION HCL ER (XL) 150 MG PO TB24: 450.0000 mg | ORAL_TABLET | Freq: Every day | ORAL | 1 refills | Status: DC

## 2016-01-26 MED FILL — BUPROPION HCL XL 150 MG TAB: 150 | 90 days supply | Qty: 270 | Fill #0

## 2016-01-26 MED FILL — traZODone HCL 50 MG TABS: 50 | 90 days supply | Qty: 90 | Fill #0 | Status: TO

## 2016-01-27 MED FILL — DEXTROAMP-AMPHETAM 12.5 MG: 12.5 | 30 days supply | Qty: 30 | Fill #0

## 2016-02-03 DIAGNOSIS — F330 Major depressive disorder, recurrent, mild: Secondary | ICD-10-CM | POA: Diagnosis not present

## 2016-02-25 ENCOUNTER — Other Ambulatory Visit: Payer: Self-pay | Admitting: Physician Assistant

## 2016-02-26 MED ORDER — AMPHETAMINE-DEXTROAMPHETAMINE 15 MG PO TABS: 15.0000 mg | ORAL_TABLET | Freq: Every day | ORAL | 0 refills | Status: DC

## 2016-02-26 NOTE — Telephone Encounter (Signed)
Done - pt knows through my chart 

## 2016-02-27 NOTE — Telephone Encounter (Signed)
L/m rx up front 

## 2016-03-01 MED FILL — AMPHETAMINE SALTS 15 MG TAB: 15 | 30 days supply | Qty: 30 | Fill #0

## 2016-03-16 DIAGNOSIS — F330 Major depressive disorder, recurrent, mild: Secondary | ICD-10-CM | POA: Diagnosis not present

## 2016-03-22 ENCOUNTER — Other Ambulatory Visit: Payer: Self-pay | Admitting: Physician Assistant

## 2016-03-22 DIAGNOSIS — F4323 Adjustment disorder with mixed anxiety and depressed mood: Secondary | ICD-10-CM

## 2016-03-22 MED ORDER — DESVENLAFAXINE SUCCINATE ER 100 MG PO TB24: 100.0000 mg | ORAL_TABLET | Freq: Every day | ORAL | 0 refills | Status: DC

## 2016-03-22 MED FILL — DESVENLAFAXINE SUC ER 100 M: 100 | 30 days supply | Qty: 30 | Fill #0

## 2016-03-22 NOTE — Telephone Encounter (Signed)
Noted  

## 2016-03-22 NOTE — Telephone Encounter (Signed)
Last office visit was 12/29/2015. Patient is being managed by PA-Weber. I provided 30 day refill. I left VM for patient recommending she come back for recheck given change in her dose, new prescription for stimulant. I will forward to PA-Weber for review.

## 2016-03-22 NOTE — Telephone Encounter (Signed)
12/2015 last ov 

## 2016-03-22 NOTE — Addendum Note (Signed)
Addended by: Morrell RiddleWEBER, Lucah Petta L on: 03/22/2016 02:51 PM   Modules accepted: Orders

## 2016-03-24 ENCOUNTER — Encounter: Payer: Self-pay | Admitting: Physician Assistant

## 2016-03-24 ENCOUNTER — Ambulatory Visit (INDEPENDENT_AMBULATORY_CARE_PROVIDER_SITE_OTHER): Payer: 59 | Admitting: Physician Assistant

## 2016-03-24 VITALS — BP 125/75 | HR 80 | Temp 98.3°F | Resp 16 | Ht 69.0 in | Wt 183.6 lb

## 2016-03-24 DIAGNOSIS — F99 Mental disorder, not otherwise specified: Secondary | ICD-10-CM | POA: Diagnosis not present

## 2016-03-24 DIAGNOSIS — F329 Major depressive disorder, single episode, unspecified: Secondary | ICD-10-CM

## 2016-03-24 DIAGNOSIS — F322 Major depressive disorder, single episode, severe without psychotic features: Secondary | ICD-10-CM | POA: Diagnosis not present

## 2016-03-24 DIAGNOSIS — R5383 Other fatigue: Secondary | ICD-10-CM

## 2016-03-24 DIAGNOSIS — F5105 Insomnia due to other mental disorder: Secondary | ICD-10-CM | POA: Diagnosis not present

## 2016-03-24 DIAGNOSIS — F32A Depression, unspecified: Secondary | ICD-10-CM

## 2016-03-24 MED ORDER — GABAPENTIN 100 MG PO CAPS: 100.0000 mg | ORAL_CAPSULE | Freq: Every day | ORAL | 3 refills | Status: DC

## 2016-03-24 MED ORDER — AMPHETAMINE-DEXTROAMPHET ER 10 MG PO CP24: 10.0000 mg | ORAL_CAPSULE | Freq: Every day | ORAL | 0 refills | Status: DC

## 2016-03-24 NOTE — Progress Notes (Signed)
Jennifer Whitaker  MRN: [id number] DOB: [birth year]  Subjective:  Pt presents to clinic for medication refill.  Her depression is not doing well for the last week - she has had suicidal thoughts but no intent or plan - just keeps thinking "do I really want to live like this - is there anything that can be done to help me".  No recent increase in stress or worry per patient  But with talking to the patient suspect this is the case but she is not aware - she currently she has a really high deductible insurance where she has to pay for everything out of pocket and she is worried about the cost of medications and office visits.  She feels fine with work and school - 2 classes and good grades so far.  She has had a few panic attacks - she typically calls her mother during these and her mother accompanies her today and is worried about her.  Her anxiety is overall increased - she feels like it has increased since the addition of the adderall and it does seem worse in the afternoons -   Adderall helped with energy levels but she does think that her overall anxiety is worse and seems to be worse in the afternoon.  She does think that her energy level is slightly increased.  She feels like it helps her concentration about 4-5 hours.  Review of Systems  Psychiatric/Behavioral: Positive for decreased concentration (improved on adderall), dysphoric mood and sleep disturbance (tired all the time).    Patient Active Problem List   Diagnosis Date Noted  . Heartburn 04/04/2015  . Insomnia 12/15/2013  . Acne 09/19/2013  . Major depression (HCC) 05/29/2013  . Reaction to severe stress 05/29/2013  . HSV (herpes simplex virus) infection 11/26/2012    Current Outpatient Prescriptions on File Prior to Visit  Medication Sig Dispense Refill  . buPROPion (WELLBUTRIN XL) 150 MG 24 hr tablet Take 3 tablets (450 mg total) by mouth daily. 270 tablet 1  . Clobetasol Propionate (CLOBEX) 0.05 % shampoo Apply 1  application topically daily. 118 mL 0  . clonazePAM (KLONOPIN) 0.5 MG tablet TAKE 1 TABLET BY MOUTH 3 TIMES DAILY AS NEEDED FOR ANXIETY 10 tablet 0  . desvenlafaxine (PRISTIQ) 100 MG 24 hr tablet TAKE 1 TABLET BY MOUTH DAILY. 30 tablet 0  . norgestrel-ethinyl estradiol (ELINEST) 0.3-30 MG-MCG tablet Take 1 tablet by mouth daily. Pt to take hormone pills for 12 weeks and then use placebo pills 4 Package 3  . pantoprazole (PROTONIX) 40 MG tablet Take 1 tablet (40 mg total) by mouth daily. 90 tablet 0  . traZODone (DESYREL) 50 MG tablet Take 0.5-1 tablets (25-50 mg total) by mouth at bedtime as needed for sleep. 90 tablet 1   No current facility-administered medications on file prior to visit.     No Known Allergies  Pt patients past, family and social history were reviewed and updated.   Objective:  BP 125/75   Pulse 80   Temp 98.3 F (36.8 C) (Oral)   Resp 16   Ht 5\' 9"  (1.753 m)   Wt 183 lb 9.6 oz (83.3 kg)   LMP 03/06/2016   SpO2 97%   BMI 27.11 kg/m   Physical Exam  Constitutional: She is oriented to person, place, and time and well-developed, well-nourished, and in no distress.  HENT:  Head: Normocephalic and atraumatic.  Right Ear: Hearing and external ear normal.  Left Ear: Hearing and external ear  normal.  Eyes: Conjunctivae are normal.  Neck: Normal range of motion.  Pulmonary/Chest: Effort normal.  Neurological: She is alert and oriented to person, place, and time. Gait normal.  Skin: Skin is warm and dry.  Psychiatric: Mood, memory and judgment normal. She has a flat affect.  Vitals reviewed.   Assessment and Plan :  Severe single current episode of major depressive disorder, without psychotic features (HCC) - Plan: gabapentin (NEURONTIN) 100 MG capsule - pt has tried many SSRI without help and she is currently on pristiq and wellbutrin with daily changes in her mood - she has never had any mania behaviors - trial of mood stabilizers - d/w pt that we will be  mindful of the possible grogginess from the gabapentin - she will contact me through mychart with her progress in the next week and we will make decisions based on that about increase in dose or change in medication - we did not pick depakote due to need for medication labs.  Insomnia due to other mental disorder  Fatigue due to depression - Plan: amphetamine-dextroamphetamine (ADDERALL XR) 10 MG 24 hr capsule -- trial of extended release because it seems that the immediate release may be increasing her anxiety.  Benny Lennert PA-C  Primary Care at [facility] 03/24/2016 6:42 PM

## 2016-03-24 NOTE — Patient Instructions (Signed)
     IF you received an x-ray today, you will receive an invoice from [facility]. Please contact [facility] at [phone] with questions or concerns regarding your invoice.   IF you received labwork today, you will receive an invoice from [facility]. Please contact [facility] at [phone] with questions or concerns regarding your invoice.   Our billing staff will not be able to assist you with questions regarding bills from these companies.  You will be contacted with the lab results as soon as they are available. The fastest way to get your results is to activate your My Chart account. Instructions are located on the last page of this paperwork. If you have not heard from us regarding the results in 2 weeks, please contact this office.     

## 2016-03-25 MED FILL — GABAPENTIN 100 MG CAPSULE: 100 | 45 days supply | Qty: 90 | Fill #0

## 2016-04-04 ENCOUNTER — Encounter: Payer: Self-pay | Admitting: Physician Assistant

## 2016-04-24 ENCOUNTER — Encounter: Payer: Self-pay | Admitting: Physician Assistant

## 2016-04-26 MED ORDER — AMPHETAMINE-DEXTROAMPHET ER 15 MG PO CP24: 15.0000 mg | ORAL_CAPSULE | ORAL | 0 refills | Status: DC

## 2016-04-26 NOTE — Telephone Encounter (Signed)
Done.  Pt knows through mychart 

## 2016-04-27 MED FILL — BUPROPION HCL XL 150 MG TAB: 150 | 30 days supply | Qty: 90 | Fill #1 | Status: TO

## 2016-04-27 MED FILL — PANTOPRAZOLE SOD DR 40 MG T: 40 | 90 days supply | Qty: 90 | Fill #0 | Status: TO

## 2016-04-27 MED FILL — DESVENLAFAXINE SUC ER 100 M: 100 | 30 days supply | Qty: 30 | Fill #0 | Status: TO

## 2016-05-16 MED FILL — GABAPENTIN 100 MG CAPSULE: 100 | 45 days supply | Qty: 90 | Fill #1 | Status: TO

## 2016-06-01 ENCOUNTER — Other Ambulatory Visit: Payer: Self-pay | Admitting: Physician Assistant

## 2016-06-02 ENCOUNTER — Encounter: Payer: Self-pay | Admitting: Physician Assistant

## 2016-06-04 MED ORDER — DESVENLAFAXINE SUCCINATE ER 50 MG PO TB24: 50.0000 mg | ORAL_TABLET | Freq: Every day | ORAL | 0 refills | Status: DC

## 2016-06-05 MED ORDER — DESVENLAFAXINE SUCCINATE ER 25 MG PO TB24: 25.0000 mg | ORAL_TABLET | Freq: Every day | ORAL | 0 refills | Status: DC

## 2016-06-06 ENCOUNTER — Telehealth: Payer: Self-pay | Admitting: Physician Assistant

## 2016-06-06 NOTE — Telephone Encounter (Signed)
Pharmacy calling regarding Desvenlafaxine. It is currently on manufacturer back order. It is expected to be shipped again on 06/17/16. Pharmacy wanted to know in case an alternative needed to be prescribed.

## 2016-06-07 ENCOUNTER — Encounter: Payer: Self-pay | Admitting: Physician Assistant

## 2016-06-07 MED ORDER — AMPHETAMINE-DEXTROAMPHET ER 15 MG PO CP24: 15.0000 mg | ORAL_CAPSULE | ORAL | 0 refills | Status: DC

## 2016-06-07 MED ORDER — DESVENLAFAXINE SUCCINATE ER 50 MG PO TB24: 50.0000 mg | ORAL_TABLET | Freq: Every day | ORAL | 0 refills | Status: DC

## 2016-06-07 NOTE — Telephone Encounter (Signed)
They can give her a 2 week supply of Pristiq  I have sent that to the pharmacy.  Please let them know what I have done.

## 2016-06-07 NOTE — Telephone Encounter (Signed)
Up front for pick up 

## 2016-06-07 NOTE — Telephone Encounter (Signed)
Done.  Pt knows through mychart 

## 2016-06-24 ENCOUNTER — Other Ambulatory Visit: Payer: Self-pay | Admitting: Physician Assistant

## 2016-06-25 NOTE — Telephone Encounter (Signed)
03/24/16 last ov

## 2016-06-27 ENCOUNTER — Encounter: Payer: Self-pay | Admitting: Physician Assistant

## 2016-06-27 NOTE — Telephone Encounter (Signed)
Contacted pt through mychart for an update as I thought the patient was stopping that medication.

## 2016-07-07 ENCOUNTER — Other Ambulatory Visit: Payer: Self-pay | Admitting: Physician Assistant

## 2016-07-07 DIAGNOSIS — R12 Heartburn: Secondary | ICD-10-CM

## 2016-07-07 MED ORDER — AMPHETAMINE-DEXTROAMPHET ER 15 MG PO CP24: 15.0000 mg | ORAL_CAPSULE | ORAL | 0 refills | Status: DC

## 2016-07-07 MED ORDER — PANTOPRAZOLE SODIUM 40 MG PO TBEC: 40.0000 mg | DELAYED_RELEASE_TABLET | Freq: Every day | ORAL | 0 refills | Status: DC

## 2016-07-07 NOTE — Telephone Encounter (Signed)
Done.  Ready to pick up Adderall

## 2016-07-07 NOTE — Telephone Encounter (Signed)
03/2016

## 2016-07-14 ENCOUNTER — Other Ambulatory Visit: Payer: Self-pay | Admitting: Physician Assistant

## 2016-07-15 MED ORDER — AMPHETAMINE-DEXTROAMPHET ER 15 MG PO CP24: 15.0000 mg | ORAL_CAPSULE | ORAL | 0 refills | Status: DC

## 2016-07-15 NOTE — Telephone Encounter (Signed)
Left message on pts vm advising that prescription was ready for pick up at the clinic.

## 2016-07-15 NOTE — Telephone Encounter (Signed)
Ready for pick up

## 2016-08-14 ENCOUNTER — Other Ambulatory Visit: Payer: Self-pay | Admitting: Physician Assistant

## 2016-08-15 NOTE — Telephone Encounter (Signed)
It should be good for her to pick up and fill

## 2016-08-15 NOTE — Telephone Encounter (Signed)
Pt has one that was printed on 07/14/16 is this still good or does it need to be reprinted.

## 2016-08-16 NOTE — Telephone Encounter (Signed)
Attempted to reach pt;number listed not set up for vm Script is ready for pick up

## 2016-09-07 ENCOUNTER — Ambulatory Visit (INDEPENDENT_AMBULATORY_CARE_PROVIDER_SITE_OTHER): Payer: 59 | Admitting: Physician Assistant

## 2016-09-07 ENCOUNTER — Encounter: Payer: Self-pay | Admitting: Physician Assistant

## 2016-09-07 DIAGNOSIS — F322 Major depressive disorder, single episode, severe without psychotic features: Secondary | ICD-10-CM

## 2016-09-07 MED ORDER — AMPHETAMINE-DEXTROAMPHET ER 15 MG PO CP24: 15.0000 mg | ORAL_CAPSULE | ORAL | 0 refills | Status: DC

## 2016-09-07 MED ORDER — GABAPENTIN 100 MG PO CAPS: 200.0000 mg | ORAL_CAPSULE | Freq: Every day | ORAL | 0 refills | Status: DC

## 2016-09-07 NOTE — Progress Notes (Signed)
Jennifer Whitaker  MRN: [id number] DOB: 26-Jul-1995  PCP: Morrell RiddleWeber, Sarah L, PA-C  Chief Complaint  Patient presents with  . Medication Refill    Subjective:  Pt presents to clinic for medication discussion and discussion about next step in her care.  Last Monday saw psychiatrist [facility] due to the urging of her therapist who is also at that office.  The provider is new to practice and new to patient which makes her a little uncomfortable.  He wanted her to stop gabapentin (she did and did not taper and she felt bad she has restarted this as of yesterday), pristiq, adderall - because he felt like she was on to many medications.  Since then she has had brain zap and foggy feeling.  She felt like the pristiq never worked - and we had planned to wean off anyway.  Neurontin she felt like helped esp at the beginning but then towards the end her depression was getting so bad that she is not sure that is was helping but she stopped cold [place] and felt terrible.  He wanted her to start  rezalti but she knows the side effects are weight gain and she is not willing to do that because for the 1st tme in her life she is ok with her weight but she knows that if she gains weight she is going to have worsening depression because a lot of her depressed mood comes from her weight.  She is worried she might have a personality disorder and is never going to get better and if that is the case she wants to know.  Her current therapist she has seen for years has never brought that up and she did not feel like the pyschiatrist was with her long enough to make that diangosis.  She does feel like the one medication that truly helps is her Adderall - she gets things down and gives her some energy.  She is frustrated and does not know what to do next.  History is obtained by patient and mother.  Review of Systems  Psychiatric/Behavioral: Positive for dysphoric mood. Negative for sleep disturbance and suicidal  ideas. The patient is not nervous/anxious.     Patient Active Problem List   Diagnosis Date Noted  . Heartburn 04/04/2015  . Insomnia 12/15/2013  . Acne 09/19/2013  . Major depression (HCC) 05/29/2013  . Reaction to severe stress 05/29/2013  . HSV (herpes simplex virus) infection 11/26/2012    Current Outpatient Prescriptions on File Prior to Visit  Medication Sig Dispense Refill  . buPROPion (WELLBUTRIN XL) 150 MG 24 hr tablet Take 3 tablets (450 mg total) by mouth daily. 270 tablet 1  . Clobetasol Propionate (CLOBEX) 0.05 % shampoo Apply 1 application topically daily. 118 mL 0  . clonazePAM (KLONOPIN) 0.5 MG tablet TAKE 1 TABLET BY MOUTH 3 TIMES DAILY AS NEEDED FOR ANXIETY 10 tablet 0  . norgestrel-ethinyl estradiol (ELINEST) 0.3-30 MG-MCG tablet Take 1 tablet by mouth daily. Pt to take hormone pills for 12 weeks and then use placebo pills 4 Package 3  . pantoprazole (PROTONIX) 40 MG tablet Take 1 tablet (40 mg total) by mouth daily. 90 tablet 0  . traZODone (DESYREL) 50 MG tablet Take 0.5-1 tablets (25-50 mg total) by mouth at bedtime as needed for sleep. 90 tablet 1   No current facility-administered medications on file prior to visit.     No Known Allergies  Past Medical History:  Diagnosis Date  . Acne   .  Anxiety   . Depression    Social History   Social History Narrative   Lives with her boyfriend.  Mother lives nearby. Her father lives nearby. Her brother lives nearby.   Social History  Substance Use Topics  . Smoking status: Never Smoker  . Smokeless tobacco: Never Used  . Alcohol use No   family history includes Cancer in her paternal grandfather.     Objective:  BP 119/78   Pulse 72   Temp 98.5 F (36.9 C) (Oral)   Resp 17   Ht 5' 9.5" (1.765 m)   Wt 168 lb (76.2 kg)   SpO2 98%   BMI 24.45 kg/m  Body mass index is 24.45 kg/m.  Physical Exam  Constitutional: She is oriented to person, place, and time and well-developed, well-nourished, and in  no distress.  HENT:  Head: Normocephalic and atraumatic.  Right Ear: Hearing and external ear normal.  Left Ear: Hearing and external ear normal.  Eyes: Conjunctivae are normal.  Neck: Normal range of motion.  Pulmonary/Chest: Effort normal.  Neurological: She is alert and oriented to person, place, and time. Gait normal.  Skin: Skin is warm and dry.  Psychiatric: Mood, memory, affect and judgment normal.  Vitals reviewed.   Wt Readings from Last 3 Encounters:  09/07/16 168 lb (76.2 kg)  03/24/16 183 lb 9.6 oz (83.3 kg)  12/29/15 184 lb 3.2 oz (83.6 kg)   Spent 30 mins with the patient - greater than 50% of the visit was face to face counseling patient regarding her symptoms and next steps and things that she is concerned.  Assessment and Plan :  Severe single current episode of major depressive disorder, without psychotic features (HCC) - Plan: gabapentin (NEURONTIN) 100 MG capsule,  For now she wants to continue the gabapentin and the adderall.  Both were written today - we talked about different options - a different therapist to look for something diffferent or possible treatment based on magnetic therapy vs traditional medications.  Depakote is an  Option but with the lab work that is needed pt is a little hesitant. Pt has deep seeded weight issues and medications with weight gain side effects are going to be really hard for her to take.  She will consider her next step and I will help her as she decides is best for her.  Benny Lennert PA-C  Primary Care at [facility] 09/09/2016 6:52 PM

## 2016-09-07 NOTE — Patient Instructions (Addendum)
  [name] at [facility] is the therapist that I work with.  Emerson Montearrish McKinney is doing the magnetic treatment for depression.   IF you received an x-ray today, you will receive an invoice from [facility]. Please contact [facility] at [phone] with questions or concerns regarding your invoice.   IF you received labwork today, you will receive an invoice from [facility]. Please contact [facility] at [phone] with questions or concerns regarding your invoice.   Our billing staff will not be able to assist you with questions regarding bills from these companies.  You will be contacted with the lab results as soon as they are available. The fastest way to get your results is to activate your My Chart account. Instructions are located on the last page of this paperwork. If you have not heard from us regarding the results in 2 weeks, please contact this office.

## 2016-09-09 ENCOUNTER — Encounter: Payer: Self-pay | Admitting: Physician Assistant

## 2016-09-19 ENCOUNTER — Other Ambulatory Visit: Payer: Self-pay | Admitting: Physician Assistant

## 2016-09-20 ENCOUNTER — Encounter: Payer: Self-pay | Admitting: Physician Assistant

## 2016-09-20 NOTE — Telephone Encounter (Signed)
Please advise...you discontinued this medication on 09/07/16.

## 2016-09-23 MED ORDER — AMPHETAMINE-DEXTROAMPHET ER 25 MG PO CP24: 25.0000 mg | ORAL_CAPSULE | ORAL | 0 refills | Status: DC

## 2016-09-23 NOTE — Telephone Encounter (Signed)
Done - pt knows through my chart 

## 2016-09-23 NOTE — Telephone Encounter (Signed)
Got a call from Dr Ledon SnareMcKnight - he has evaluated the patient and he has diagnosed her with ADD - ring of fire - he believes that her depression is from her ADD being at this under-treated and before untreated.  He does not believe she has a personality disorder.  He suggests increase stimulant medication - decrease wellbutrin and stay on gabapentin as many of these patients need a mood stabilizer and she does seem to feel better since on this medication.

## 2016-09-30 MED ORDER — AMPHETAMINE-DEXTROAMPHET ER 30 MG PO CP24: 30.0000 mg | ORAL_CAPSULE | Freq: Two times a day (BID) | ORAL | 0 refills | Status: DC

## 2016-09-30 NOTE — Telephone Encounter (Signed)
Got a call from [name] - she is doing better on the Adderall - more energy and less mood changes.  Increase dose and add the potential of bid dosing.

## 2016-10-10 ENCOUNTER — Other Ambulatory Visit: Payer: Self-pay | Admitting: Physician Assistant

## 2016-10-10 DIAGNOSIS — R12 Heartburn: Secondary | ICD-10-CM

## 2016-10-12 ENCOUNTER — Other Ambulatory Visit: Payer: Self-pay | Admitting: Physician Assistant

## 2016-10-12 DIAGNOSIS — Z3041 Encounter for surveillance of contraceptive pills: Secondary | ICD-10-CM

## 2016-11-09 ENCOUNTER — Other Ambulatory Visit: Payer: Self-pay | Admitting: Physician Assistant

## 2016-11-09 DIAGNOSIS — R12 Heartburn: Secondary | ICD-10-CM

## 2016-11-09 DIAGNOSIS — F99 Mental disorder, not otherwise specified: Principal | ICD-10-CM

## 2016-11-09 DIAGNOSIS — F5105 Insomnia due to other mental disorder: Secondary | ICD-10-CM

## 2016-11-10 ENCOUNTER — Other Ambulatory Visit: Payer: Self-pay | Admitting: Physician Assistant

## 2016-11-10 DIAGNOSIS — R12 Heartburn: Secondary | ICD-10-CM

## 2016-11-11 MED ORDER — PANTOPRAZOLE SODIUM 40 MG PO TBEC: 40.0000 mg | DELAYED_RELEASE_TABLET | Freq: Every day | ORAL | 0 refills | Status: DC

## 2016-11-11 MED ORDER — CLONAZEPAM 0.5 MG PO TABS: ORAL_TABLET | ORAL | 0 refills | Status: DC

## 2016-11-11 MED ORDER — AMPHETAMINE-DEXTROAMPHET ER 30 MG PO CP24: 30.0000 mg | ORAL_CAPSULE | Freq: Two times a day (BID) | ORAL | 0 refills | Status: DC

## 2016-11-11 NOTE — Telephone Encounter (Signed)
Done - pt knows through my chart 

## 2016-11-12 NOTE — Telephone Encounter (Signed)
Called pt to come pick up RX, she verbalized understanding.

## 2016-11-29 ENCOUNTER — Other Ambulatory Visit: Payer: Self-pay | Admitting: Physician Assistant

## 2016-11-29 DIAGNOSIS — F4323 Adjustment disorder with mixed anxiety and depressed mood: Secondary | ICD-10-CM

## 2016-11-29 NOTE — Telephone Encounter (Signed)
Please all the patient I believe she is only taking  currently.

## 2016-11-29 NOTE — Telephone Encounter (Signed)
Please advise 

## 2016-11-29 NOTE — Telephone Encounter (Signed)
I called pt. Left vm  For pt to call office back about clarification on Wellbutrin mg

## 2016-11-29 NOTE — Telephone Encounter (Signed)
PATIENT CALLED BACK TO TELL SARAH THAT SHE TAKES WELLBUTRIN 300 MG. MBC

## 2016-12-01 MED ORDER — BUPROPION HCL ER (XL) 300 MG PO TB24: 300.0000 mg | ORAL_TABLET | Freq: Every day | ORAL | 1 refills | Status: DC

## 2016-12-01 NOTE — Telephone Encounter (Signed)
Please let the patient know that I changed her dose to Wellbutrin  so she only have to take 1 pill a day

## 2016-12-01 NOTE — Telephone Encounter (Signed)
IC pt - LMOVM - strength of Welbutrin was changed to  so she would only have to take 1 pill per day - per Sarah's instructions.

## 2016-12-01 NOTE — Telephone Encounter (Signed)
Please see note below. 

## 2016-12-01 NOTE — Telephone Encounter (Signed)
Done

## 2016-12-13 ENCOUNTER — Encounter: Payer: Self-pay | Admitting: Physician Assistant

## 2016-12-13 ENCOUNTER — Other Ambulatory Visit: Payer: Self-pay | Admitting: Physician Assistant

## 2016-12-13 DIAGNOSIS — F322 Major depressive disorder, single episode, severe without psychotic features: Secondary | ICD-10-CM

## 2016-12-13 DIAGNOSIS — R12 Heartburn: Secondary | ICD-10-CM

## 2016-12-15 MED ORDER — PANTOPRAZOLE SODIUM 40 MG PO TBEC: 40.0000 mg | DELAYED_RELEASE_TABLET | Freq: Every day | ORAL | 0 refills | Status: DC

## 2016-12-15 MED ORDER — GABAPENTIN 100 MG PO CAPS: 200.0000 mg | ORAL_CAPSULE | Freq: Every day | ORAL | 0 refills | Status: DC

## 2016-12-15 MED ORDER — AMPHETAMINE-DEXTROAMPHET ER 30 MG PO CP24: 30.0000 mg | ORAL_CAPSULE | Freq: Two times a day (BID) | ORAL | 0 refills | Status: DC

## 2016-12-15 NOTE — Telephone Encounter (Signed)
Done. I will be in tomorrow to sign.

## 2016-12-18 ENCOUNTER — Encounter: Payer: Self-pay | Admitting: Physician Assistant

## 2016-12-19 ENCOUNTER — Other Ambulatory Visit: Payer: Self-pay | Admitting: Physician Assistant

## 2016-12-19 DIAGNOSIS — F4323 Adjustment disorder with mixed anxiety and depressed mood: Secondary | ICD-10-CM

## 2016-12-20 ENCOUNTER — Other Ambulatory Visit: Payer: Self-pay

## 2016-12-20 DIAGNOSIS — R12 Heartburn: Secondary | ICD-10-CM

## 2016-12-20 MED ORDER — PANTOPRAZOLE SODIUM 40 MG PO TBEC: 40.0000 mg | DELAYED_RELEASE_TABLET | Freq: Every day | ORAL | 0 refills | Status: DC

## 2016-12-25 ENCOUNTER — Other Ambulatory Visit: Payer: Self-pay | Admitting: Physician Assistant

## 2016-12-25 DIAGNOSIS — F4323 Adjustment disorder with mixed anxiety and depressed mood: Secondary | ICD-10-CM

## 2017-01-03 ENCOUNTER — Other Ambulatory Visit: Payer: Self-pay | Admitting: Physician Assistant

## 2017-01-03 DIAGNOSIS — Z3041 Encounter for surveillance of contraceptive pills: Secondary | ICD-10-CM

## 2017-02-05 ENCOUNTER — Other Ambulatory Visit: Payer: Self-pay | Admitting: Physician Assistant

## 2017-02-05 DIAGNOSIS — F4323 Adjustment disorder with mixed anxiety and depressed mood: Secondary | ICD-10-CM

## 2017-02-07 NOTE — Telephone Encounter (Signed)
Patient notified via My Chart.  Meds ordered this encounter  Medications  . traZODone (DESYREL) 50 MG tablet    Sig: TAKE 1 /2 - 1 TABLET BY MOUTH AT BEDTIME AS NEEDED SLEEP    Dispense:  90 tablet    Refill:  0

## 2017-03-13 ENCOUNTER — Telehealth: Payer: Self-pay | Admitting: Physician Assistant

## 2017-03-13 NOTE — Telephone Encounter (Signed)
Pt called in to schedule CPE  with PCP. Apt has been scheduled. Pt would like to know if provider is able to supply a refill on her Adderall medication until apt?    Please advise.    [phone]

## 2017-03-14 ENCOUNTER — Other Ambulatory Visit: Payer: Self-pay | Admitting: Physician Assistant

## 2017-03-14 DIAGNOSIS — F322 Major depressive disorder, single episode, severe without psychotic features: Secondary | ICD-10-CM

## 2017-03-14 MED ORDER — AMPHETAMINE-DEXTROAMPHET ER 30 MG PO CP24: 30.0000 mg | ORAL_CAPSULE | Freq: Two times a day (BID) | ORAL | 0 refills | Status: DC

## 2017-03-14 NOTE — Telephone Encounter (Signed)
Done. - please call and let the patient know her Rx have been sent to the pharmacy.

## 2017-03-14 NOTE — Telephone Encounter (Signed)
Called patient to let he know that her Adderall is at the pharmacy. Message left.

## 2017-03-22 ENCOUNTER — Other Ambulatory Visit: Payer: Self-pay | Admitting: Physician Assistant

## 2017-03-22 DIAGNOSIS — R12 Heartburn: Secondary | ICD-10-CM

## 2017-03-28 ENCOUNTER — Encounter: Payer: 59 | Admitting: Physician Assistant

## 2017-04-01 ENCOUNTER — Other Ambulatory Visit: Payer: Self-pay | Admitting: Physician Assistant

## 2017-04-01 DIAGNOSIS — Z3041 Encounter for surveillance of contraceptive pills: Secondary | ICD-10-CM

## 2017-04-07 ENCOUNTER — Encounter: Payer: Self-pay | Admitting: Physician Assistant

## 2017-04-07 ENCOUNTER — Ambulatory Visit (INDEPENDENT_AMBULATORY_CARE_PROVIDER_SITE_OTHER): Payer: 59 | Admitting: Physician Assistant

## 2017-04-07 ENCOUNTER — Other Ambulatory Visit: Payer: Self-pay

## 2017-04-07 VITALS — BP 104/72 | HR 112 | Temp 98.8°F | Resp 18 | Ht 69.49 in | Wt 142.6 lb

## 2017-04-07 DIAGNOSIS — Z3041 Encounter for surveillance of contraceptive pills: Secondary | ICD-10-CM

## 2017-04-07 DIAGNOSIS — R12 Heartburn: Secondary | ICD-10-CM | POA: Diagnosis not present

## 2017-04-07 DIAGNOSIS — F5105 Insomnia due to other mental disorder: Secondary | ICD-10-CM | POA: Diagnosis not present

## 2017-04-07 DIAGNOSIS — R21 Rash and other nonspecific skin eruption: Secondary | ICD-10-CM

## 2017-04-07 DIAGNOSIS — F439 Reaction to severe stress, unspecified: Secondary | ICD-10-CM | POA: Diagnosis not present

## 2017-04-07 DIAGNOSIS — F4323 Adjustment disorder with mixed anxiety and depressed mood: Secondary | ICD-10-CM

## 2017-04-07 DIAGNOSIS — F99 Mental disorder, not otherwise specified: Secondary | ICD-10-CM | POA: Diagnosis not present

## 2017-04-07 DIAGNOSIS — Z0000 Encounter for general adult medical examination without abnormal findings: Secondary | ICD-10-CM

## 2017-04-07 DIAGNOSIS — F322 Major depressive disorder, single episode, severe without psychotic features: Secondary | ICD-10-CM | POA: Diagnosis not present

## 2017-04-07 DIAGNOSIS — F988 Other specified behavioral and emotional disorders with onset usually occurring in childhood and adolescence: Secondary | ICD-10-CM | POA: Diagnosis not present

## 2017-04-07 DIAGNOSIS — Z01419 Encounter for gynecological examination (general) (routine) without abnormal findings: Secondary | ICD-10-CM | POA: Diagnosis not present

## 2017-04-07 MED ORDER — CLOBETASOL PROPIONATE 0.05 % EX SHAM: 1.0000 "application " | MEDICATED_SHAMPOO | Freq: Every day | CUTANEOUS | 0 refills | Status: DC

## 2017-04-07 MED ORDER — AMPHETAMINE-DEXTROAMPHET ER 30 MG PO CP24: 30.0000 mg | ORAL_CAPSULE | Freq: Two times a day (BID) | ORAL | 0 refills | Status: DC

## 2017-04-07 MED ORDER — GABAPENTIN 100 MG PO CAPS: 200.0000 mg | ORAL_CAPSULE | Freq: Every day | ORAL | 3 refills | Status: DC

## 2017-04-07 MED ORDER — BUPROPION HCL ER (XL) 300 MG PO TB24: 300.0000 mg | ORAL_TABLET | Freq: Every day | ORAL | 3 refills | Status: DC

## 2017-04-07 MED ORDER — CLONAZEPAM 0.5 MG PO TABS: ORAL_TABLET | ORAL | 0 refills | Status: DC

## 2017-04-07 MED ORDER — TRAZODONE HCL 50 MG PO TABS
50.0000 mg | ORAL_TABLET | Freq: Every day | ORAL | 3 refills | Status: DC
Start: 2017-04-07 — End: 2017-07-09

## 2017-04-07 MED ORDER — NORGESTREL-ETHINYL ESTRADIOL 0.3-30 MG-MCG PO TABS: ORAL_TABLET | ORAL | 3 refills | Status: AC

## 2017-04-07 MED ORDER — PANTOPRAZOLE SODIUM 40 MG PO TBEC: 40.0000 mg | DELAYED_RELEASE_TABLET | Freq: Every day | ORAL | 3 refills | Status: DC

## 2017-04-07 NOTE — Patient Instructions (Signed)
     IF you received an x-ray today, you will receive an invoice from [facility]. Please contact [facility] at [phone] with questions or concerns regarding your invoice.   IF you received labwork today, you will receive an invoice from [facility]. Please contact [facility] at [phone] with questions or concerns regarding your invoice.   Our billing staff will not be able to assist you with questions regarding bills from these companies.  You will be contacted with the lab results as soon as they are available. The fastest way to get your results is to activate your My Chart account. Instructions are located on the last page of this paperwork. If you have not heard from us regarding the results in 2 weeks, please contact this office.     

## 2017-04-07 NOTE — Progress Notes (Signed)
Jennifer Whitaker  MRN: [id number] DOB: 04/11/1995  PCP: Jennifer Riddle, PA-C  Subjective:  Pt presents to clinic for a CPE.  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      Typical meals for patient: 3 meals a day Typical beverage choices: water Exercises: walking outside every 3 days Sleeps: 8 hrs per night and sleeping well   Patient Active Problem List   Diagnosis Date Noted  . Heartburn still really needs protonix - she tried prilosec OTC when she ran out and it does not control her symptoms 04/04/2015  . Insomnia - on trazodone - helps - she did have to increase her dose when she stopped marji at night  12/15/2013  . Acne 09/19/2013  . Major depression (HCC) - doing well - she stopped marji and her fog lifted and she is more clear 05/29/2013  . Reaction to severe stress- only takes klonopin 2 times every 3 months 05/29/2013  . HSV (herpes simplex virus) infection 11/26/2012    Review of Systems  Constitutional: Negative.   HENT: Negative.        Swollen neck lymph nodes from illness about 10 days ago - no current symptoms of cold  Eyes: Negative.   Respiratory: Negative.   Cardiovascular: Negative.   Gastrointestinal: Negative.   Endocrine: Negative.   Genitourinary: Negative.   Musculoskeletal: Negative.   Skin: Negative.   Allergic/Immunologic: Negative.   Neurological: Negative.   Hematological: Negative.   Psychiatric/Behavioral: Negative.      Current Outpatient Medications on File Prior to Visit  Medication Sig Dispense Refill  . [START ON 05/12/2017] amphetamine-dextroamphetamine (ADDERALL XR) 30 MG 24 hr capsule Take 1 capsule (30 mg total) by mouth 2 (two) times daily. 60 capsule 0  . amphetamine-dextroamphetamine (ADDERALL XR) 30 MG 24 hr capsule Take 1 capsule (30 mg total) by mouth  2 (two) times daily. (Patient not taking: Reported on 04/07/2017) 60 capsule 0   No current facility-administered medications on file prior to visit.     No Known Allergies  Social History   Socioeconomic History  . Marital status: Single    Spouse name: n/a  . Number of children: 0  . Years of education: 12+  . Highest education level: None  Social Needs  . Financial resource strain: None  . Food insecurity - worry: None  . Food insecurity - inability: None  . Transportation needs - medical: None  . Transportation needs - non-medical: None  Occupational History  . Occupation: Retail buyer: STUDENT    Comment: [employer]  Tobacco Use  . Smoking status: Never Smoker  . Smokeless tobacco: Never Used  Substance and Sexual Activity  . Alcohol use: Yes    Comment: wine week - 1-2 glasses a week  . Drug use: Yes    Types: Marijuana    Comment: once every 2 weeks  . Sexual activity: Yes    Birth control/protection: None, Pill  Other Topics Concern  . None  Social History Narrative   Lives with her boyfriend.     Immediate family lives nearby.   Parents divcored    Past Surgical History:  Procedure Laterality Date  . WISDOM TOOTH EXTRACTION Bilateral     Family History  Problem Relation Age of Onset  .  Cancer Paternal Grandfather      Objective:  BP 104/72   Pulse (!) 112   Temp 98.8 F (37.1 C) (Oral)   Resp 18   Ht 5' 9.49" (1.765 m)   Wt 142 lb 9.6 oz (64.7 kg)   LMP 03/23/2017   SpO2 100%   BMI 20.76 kg/m   Physical Exam  Constitutional: She is oriented to person, place, and time and well-developed, well-nourished, and in no distress.  HENT:  Head: Normocephalic and atraumatic.  Right Ear: Hearing, tympanic membrane, external ear and ear canal normal.  Left Ear: Hearing, tympanic membrane, external ear and ear canal normal.  Nose: Nose normal.  Mouth/Throat: Uvula is midline, oropharynx is clear and moist and mucous membranes are normal.   Eyes: Conjunctivae and EOM are normal. Pupils are equal, round, and reactive to light.  Neck: Trachea normal and normal range of motion. Neck supple. No thyroid mass and no thyromegaly present.  Cardiovascular: Normal rate, regular rhythm and normal heart sounds.  No murmur heard. Pulmonary/Chest: Effort normal and breath sounds normal. She has no wheezes. Right breast exhibits no inverted nipple, no mass, no nipple discharge, no skin change and no tenderness. Left breast exhibits mass (palpable nodules under left lower areola - she has had an US which showed fibrous tissue per patient). Left breast exhibits no inverted nipple, no nipple discharge, no skin change and no tenderness. Breasts are symmetrical.  Abdominal: Soft. Bowel sounds are normal. There is no tenderness.  Genitourinary: Vagina normal, right adnexa normal, left adnexa normal and vulva normal.  Musculoskeletal: Normal range of motion.  Lymphadenopathy:    She has cervical adenopathy.       Right cervical: Superficial cervical (larger than on the left - mild tender) adenopathy present.       Left cervical: Superficial cervical adenopathy present.  Neurological: She is alert and oriented to person, place, and time. She has normal motor skills, normal sensation, normal strength and normal reflexes. Gait normal.  Skin: Skin is warm and dry.  Psychiatric: Mood, memory, affect and judgment normal.    Wt Readings from Last 3 Encounters:  04/07/17 142 lb 9.6 oz (64.7 kg)  09/07/16 168 lb (76.2 kg)  03/24/16 183 lb 9.6 oz (83.3 kg)     Visual Acuity Screening   Right eye Left eye Both eyes  Without correction:     With correction: 20/15 20/20 20/15     Assessment and Plan :  Annual physical exam  Encounter for gynecological examination without abnormal finding - Plan: Pap IG w/ reflex to HPV when ASC-U  Heartburn - Plan: pantoprazole (PROTONIX) 40 MG tablet  Adjustment disorder with mixed anxiety and depressed  mood  Severe single current episode of major depressive disorder, without psychotic features (HCC) - Plan: gabapentin (NEURONTIN) 100 MG capsule, buPROPion (WELLBUTRIN XL) 300 MG 24 hr tablet - continue current medications - improved since stop of marjiuana  Encounter for surveillance of contraceptive pills - Plan: norgestrel-ethinyl estradiol (CRYSELLE-28) 0.3-30 MG-MCG tablet - refill - pt does not wish for pregnancy  Insomnia due to other mental disorder - Plan: traZODone (DESYREL) 50 MG tablet - she has needed more since stop of nightly marjuana - she is well controlled on this dose of medications  Reaction to severe stress - Plan: clonazePAM (KLONOPIN) 0.5 MG tablet - rare us - refill given today  Attention deficit disorder (ADD) without hyperactivity - Plan: amphetamine-dextroamphetamine (ADDERALL XR) 30 MG 24 hr capsule - pt needs bid dosing  to to signifant diease as well as long day due to work and school - if her insurance will not cover we will switch to vyvanse or mydayis for once daily dosing and extended coverage -   Rash - Plan: Clobetasol Propionate (CLOBEX) 0.05 % shampoo - continue to use prn - she uses it about once every 2 months for about 2 weeks - seems to need more in the winter months.  Benny Lennert PA-C  Primary Care at [facility] 04/07/2017 3:06 PM

## 2017-04-11 ENCOUNTER — Encounter: Payer: Self-pay | Admitting: Physician Assistant

## 2017-04-11 LAB — PAP IG W/ RFLX HPV ASCU: PAP Smear Comment: 0

## 2017-04-13 NOTE — Telephone Encounter (Signed)
Please call the pharmacy - I sent in a clobex shamppo that pt should be able to get generic but she stated it was really expensive - this happen a year or so ago and I switched it to a generic - if that is not what I sent in could you find out what the patient has been getting and send that to the pharmacy for me -- or they can send a refill request and I will authroize it (refill request from pharmacy may be the easiest to do)

## 2017-04-16 ENCOUNTER — Other Ambulatory Visit: Payer: Self-pay | Admitting: Physician Assistant

## 2017-04-16 DIAGNOSIS — F439 Reaction to severe stress, unspecified: Secondary | ICD-10-CM

## 2017-04-17 NOTE — Telephone Encounter (Signed)
Can someone call the pharmacy ([facility]) that I sent her Adderal to - she should have one for 1/22, 2/22 and 3/22 - she is telling me that she has none at the pharmacy.

## 2017-04-17 NOTE — Telephone Encounter (Signed)
LOV:04/07/17 Jennifer ArgueSarah Weber,PA [facility]

## 2017-04-17 NOTE — Telephone Encounter (Signed)
[facility] to [facility]. [facility] states patient picked up her Adderall XR 30mg  1/22 prescription, her 2/22 and 3/22 are on file. She just ran Adderall XR 30mg  prescription, will be $109. Clobetasol shampoo is 178 for generic.

## 2017-04-18 NOTE — Telephone Encounter (Signed)
Done

## 2017-04-18 NOTE — Telephone Encounter (Signed)
Called and spoke with pharmacy tech about Adderall - Summer  Adderall with her insurance is 70$109 and without her insurance it is $382

## 2017-04-18 NOTE — Telephone Encounter (Signed)
Phone call to pharmacy, spoke with Summer. She states she is not sure how a prior auth can be initiated, Adderall has already run through insurance. She cannot see alternative to Clobetasol shampoo that has been prescribed in the last two years.

## 2017-04-18 NOTE — Telephone Encounter (Signed)
Please check - these medications need to be run through her insurance.  I expect to get a need for PA for her adderall but that is what I want.  Also what shampoo has the patient been getting - it was much cheaper for her.  I need them to send me a refill request please.

## 2017-05-16 ENCOUNTER — Other Ambulatory Visit: Payer: Self-pay | Admitting: Physician Assistant

## 2017-05-16 DIAGNOSIS — F988 Other specified behavioral and emotional disorders with onset usually occurring in childhood and adolescence: Secondary | ICD-10-CM

## 2017-05-19 MED ORDER — AMPHETAMINE-DEXTROAMPHET ER 30 MG PO CP24: 30.0000 mg | ORAL_CAPSULE | Freq: Two times a day (BID) | ORAL | 0 refills | Status: DC

## 2017-05-19 NOTE — Telephone Encounter (Signed)
Done

## 2017-06-06 ENCOUNTER — Encounter: Payer: Self-pay | Admitting: Physician Assistant

## 2017-06-15 ENCOUNTER — Other Ambulatory Visit: Payer: Self-pay | Admitting: Physician Assistant

## 2017-06-15 DIAGNOSIS — F988 Other specified behavioral and emotional disorders with onset usually occurring in childhood and adolescence: Secondary | ICD-10-CM

## 2017-06-16 NOTE — Telephone Encounter (Signed)
Patient is requesting a refill of the following medications: Requested Prescriptions   Pending Prescriptions Disp Refills  . amphetamine-dextroamphetamine (ADDERALL XR) 30 MG 24 hr capsule 60 capsule 0    Sig: Take 1 capsule (30 mg total) by mouth 2 (two) times daily.    Date of patient request: 06/15/2017 Last office visit: 04/07/2017 Date of last refill: 05/19/2017 Last refill amount: 60 Follow up time period per chart: return in 6 months

## 2017-06-17 MED ORDER — AMPHETAMINE-DEXTROAMPHET ER 30 MG PO CP24
30.0000 mg | ORAL_CAPSULE | Freq: Two times a day (BID) | ORAL | 0 refills | Status: DC
Start: 2017-08-17 — End: 2017-07-20

## 2017-06-17 MED ORDER — AMPHETAMINE-DEXTROAMPHET ER 30 MG PO CP24: 30.0000 mg | ORAL_CAPSULE | Freq: Two times a day (BID) | ORAL | 0 refills | Status: DC

## 2017-06-17 NOTE — Telephone Encounter (Signed)
Done

## 2017-06-20 ENCOUNTER — Other Ambulatory Visit: Payer: Self-pay | Admitting: Physician Assistant

## 2017-06-20 DIAGNOSIS — Z3041 Encounter for surveillance of contraceptive pills: Secondary | ICD-10-CM

## 2017-07-09 ENCOUNTER — Other Ambulatory Visit: Payer: Self-pay | Admitting: Physician Assistant

## 2017-07-09 DIAGNOSIS — F99 Mental disorder, not otherwise specified: Principal | ICD-10-CM

## 2017-07-09 DIAGNOSIS — F5105 Insomnia due to other mental disorder: Secondary | ICD-10-CM

## 2017-07-10 NOTE — Telephone Encounter (Signed)
Patient requesting refill, please advise. Thank you 

## 2017-07-16 ENCOUNTER — Other Ambulatory Visit: Payer: Self-pay | Admitting: Physician Assistant

## 2017-07-16 DIAGNOSIS — F988 Other specified behavioral and emotional disorders with onset usually occurring in childhood and adolescence: Secondary | ICD-10-CM

## 2017-07-20 MED ORDER — AMPHETAMINE-DEXTROAMPHET ER 30 MG PO CP24
30.0000 mg | ORAL_CAPSULE | Freq: Two times a day (BID) | ORAL | 0 refills | Status: DC
Start: 2017-07-20 — End: 2017-10-06

## 2017-07-20 MED ORDER — AMPHETAMINE-DEXTROAMPHET ER 30 MG PO CP24: 30.0000 mg | ORAL_CAPSULE | Freq: Two times a day (BID) | ORAL | 0 refills | Status: DC

## 2017-07-22 ENCOUNTER — Other Ambulatory Visit: Payer: Self-pay | Admitting: Physician Assistant

## 2017-07-22 DIAGNOSIS — F322 Major depressive disorder, single episode, severe without psychotic features: Secondary | ICD-10-CM

## 2017-08-14 ENCOUNTER — Other Ambulatory Visit: Payer: Self-pay | Admitting: Physician Assistant

## 2017-08-14 ENCOUNTER — Telehealth: Payer: Self-pay | Admitting: *Deleted

## 2017-08-14 NOTE — Telephone Encounter (Signed)
Pt is requesting Adderall XR 30 mg, msg left that refills are at pharmacy, request has been made a few days early. Patient was asked to contact her pharmacy, next refill is for 08/20/17 and for 09/19/17.

## 2017-09-24 ENCOUNTER — Encounter: Payer: Self-pay | Admitting: Physician Assistant

## 2017-09-24 DIAGNOSIS — F322 Major depressive disorder, single episode, severe without psychotic features: Secondary | ICD-10-CM

## 2017-09-27 MED ORDER — GABAPENTIN 100 MG PO CAPS: 200.0000 mg | ORAL_CAPSULE | Freq: Every day | ORAL | 3 refills | Status: DC

## 2017-10-06 ENCOUNTER — Encounter: Payer: Self-pay | Admitting: Physician Assistant

## 2017-10-06 ENCOUNTER — Ambulatory Visit (INDEPENDENT_AMBULATORY_CARE_PROVIDER_SITE_OTHER): Payer: 59 | Admitting: Physician Assistant

## 2017-10-06 ENCOUNTER — Other Ambulatory Visit: Payer: Self-pay

## 2017-10-06 DIAGNOSIS — F439 Reaction to severe stress, unspecified: Secondary | ICD-10-CM

## 2017-10-06 DIAGNOSIS — F5105 Insomnia due to other mental disorder: Secondary | ICD-10-CM

## 2017-10-06 DIAGNOSIS — F99 Mental disorder, not otherwise specified: Secondary | ICD-10-CM

## 2017-10-06 DIAGNOSIS — R12 Heartburn: Secondary | ICD-10-CM

## 2017-10-06 DIAGNOSIS — F322 Major depressive disorder, single episode, severe without psychotic features: Secondary | ICD-10-CM

## 2017-10-06 DIAGNOSIS — F988 Other specified behavioral and emotional disorders with onset usually occurring in childhood and adolescence: Secondary | ICD-10-CM | POA: Diagnosis not present

## 2017-10-06 DIAGNOSIS — R21 Rash and other nonspecific skin eruption: Secondary | ICD-10-CM | POA: Diagnosis not present

## 2017-10-06 MED ORDER — AMPHETAMINE-DEXTROAMPHET ER 30 MG PO CP24: 30.0000 mg | ORAL_CAPSULE | Freq: Two times a day (BID) | ORAL | 0 refills | Status: DC

## 2017-10-06 MED ORDER — CLONAZEPAM 0.5 MG PO TABS: ORAL_TABLET | ORAL | 0 refills | Status: AC

## 2017-10-06 MED ORDER — PANTOPRAZOLE SODIUM 40 MG PO TBEC: 40.0000 mg | DELAYED_RELEASE_TABLET | Freq: Every day | ORAL | 3 refills | Status: AC

## 2017-10-06 MED ORDER — TRAZODONE HCL 50 MG PO TABS: ORAL_TABLET | ORAL | 4 refills | Status: AC

## 2017-10-06 MED ORDER — CLOBETASOL PROPIONATE 0.05 % EX SHAM: 1.0000 "application " | MEDICATED_SHAMPOO | Freq: Every day | CUTANEOUS | 0 refills | Status: AC

## 2017-10-06 MED ORDER — AMPHETAMINE-DEXTROAMPHET ER 30 MG PO CP24: 30.0000 mg | ORAL_CAPSULE | Freq: Two times a day (BID) | ORAL | 0 refills | Status: AC

## 2017-10-06 MED ORDER — GABAPENTIN 100 MG PO CAPS: 200.0000 mg | ORAL_CAPSULE | Freq: Every day | ORAL | 3 refills | Status: AC

## 2017-10-06 MED ORDER — BUPROPION HCL ER (XL) 300 MG PO TB24: 300.0000 mg | ORAL_TABLET | Freq: Every day | ORAL | 3 refills | Status: AC

## 2017-10-06 NOTE — Patient Instructions (Addendum)
   [facility] - [address], [place], [place] [address] Phone: [phone]   If you have lab work done today you will be contacted with your lab results within the next 2 weeks.  If you have not heard from us then please contact us. The fastest way to get your results is to register for My Chart.   IF you received an x-ray today, you will receive an invoice from [facility]. Please contact [facility] at [phone] with questions or concerns regarding your invoice.   IF you received labwork today, you will receive an invoice from [facility]. Please contact [facility] at [phone] with questions or concerns regarding your invoice.   Our billing staff will not be able to assist you with questions regarding bills from these companies.  You will be contacted with the lab results as soon as they are available. The fastest way to get your results is to activate your My Chart account. Instructions are located on the last page of this paperwork. If you have not heard from us regarding the results in 2 weeks, please contact this office.     

## 2017-10-06 NOTE — Progress Notes (Signed)
Jennifer SauceJulianne M Shappell  MRN: [id number] DOB: [birth year]  PCP: Morrell RiddleWeber, Kendi Defalco L, PA-C  Chief Complaint  Patient presents with  . Medication Refill    med check     Subjective:  Pt presents to clinic for medication check. She is doing well with her medications.  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    History is obtained by patient.  Review of Systems  Constitutional: Negative for appetite change, chills, fever and unexpected weight change.  Cardiovascular: Negative for chest pain and palpitations.  Psychiatric/Behavioral: Positive for dysphoric mood (stable on medications ). Negative for sleep disturbance. The patient is nervous/anxious (stable on medications).     Patient Active Problem List   Diagnosis Date Noted  . ADD (attention deficit disorder) 04/07/2017  . Heartburn 04/04/2015  . Insomnia 12/15/2013  . Acne 09/19/2013  . Major depression (HCC) 05/29/2013  . Reaction to severe stress 05/29/2013  . HSV (herpes simplex virus) infection 11/26/2012    Current Outpatient Medications on File Prior to Visit  Medication Sig Dispense Refill  . norgestrel-ethinyl estradiol (CRYSELLE-28) 0.3-30 MG-MCG tablet TAKE 1 TABLET BY MOUTH DAILY SKIPPING PLACEBOS 112 tablet 3   No current facility-administered medications on file prior to visit.     No Known Allergies  Past Medical History:  Diagnosis Date  . Acne   . Anxiety   . Depression    Social History   Social History Narrative   Lives with her boyfriend.     Immediate family lives nearby.   Parents divorced - not a good relationship with dad   Production designer, theatre/television/filmManager at [employer]   Social History   Tobacco Use  . Smoking status: Never Smoker  . Smokeless tobacco: Never Used  Substance Use Topics  . Alcohol use: Yes    Comment: wine week - 1-2 glasses a week  . Drug use: Yes    Types: Marijuana    Comment: once every 2 weeks   family  history includes ADD / ADHD in her brother; Cancer in her paternal grandfather.     Objective:  BP 116/64   Pulse (!) 112   Temp 98.8 F (37.1 C) (Oral)   Resp 18   Ht 5' 9.49" (1.765 m)   Wt 144 lb 12.8 oz (65.7 kg)   LMP 09/08/2017   SpO2 99%   BMI 21.08 kg/m  Body mass index is 21.08 kg/m.  Wt Readings from Last 3 Encounters:  10/06/17 144 lb 12.8 oz (65.7 kg)  04/07/17 142 lb 9.6 oz (64.7 kg)  09/07/16 168 lb (76.2 kg)    Physical Exam  Constitutional: She is oriented to person, place, and time. She appears well-developed and well-nourished.  HENT:  Head: Normocephalic and atraumatic.  Right Ear: Hearing and external ear normal.  Left Ear: Hearing and external ear normal.  Eyes: Conjunctivae are normal.  Neck: Normal range of motion.  Pulmonary/Chest: Effort normal.  Neurological: She is alert and oriented to person, place, and time.  Skin: Skin is warm, dry and intact.  Psychiatric: She has a normal mood and affect. Her behavior is normal. Judgment and thought content normal.  Vitals reviewed.   Assessment and Plan :  Insomnia due to other mental disorder - Plan: traZODone (DESYREL) 50 MG tablet  Reaction to severe stress - Plan: clonazePAM (KLONOPIN) 0.5 MG tablet  Rash - Plan: Clobetasol Propionate (CLOBEX) 0.05 %  shampoo  Attention deficit disorder (ADD) without hyperactivity - Plan: amphetamine-dextroamphetamine (ADDERALL XR) 30 MG 24 hr capsule, amphetamine-dextroamphetamine (ADDERALL XR) 30 MG 24 hr capsule, amphetamine-dextroamphetamine (ADDERALL XR) 30 MG 24 hr capsule, DISCONTINUED: amphetamine-dextroamphetamine (ADDERALL XR) 30 MG 24 hr capsule, DISCONTINUED: amphetamine-dextroamphetamine (ADDERALL XR) 30 MG 24 hr capsule  Heartburn - Plan: pantoprazole (PROTONIX) 40 MG tablet  Severe single current episode of major depressive disorder, without psychotic features (HCC) - Plan: gabapentin (NEURONTIN) 100 MG capsule, buPROPion (WELLBUTRIN XL) 300 MG 24  hr tablet   Medications all refilled - she is stable and doing well on her current medication so no changes will be made today.  Patient verbalized to me that they understand the following: diagnosis, what is being done for them, what to expect and what should be done at home.  Their questions have been answered.  See after visit summary for patient specific instructions.  Benny LennertSarah Monya Kozakiewicz PA-C  Primary Care at [facility] 10/06/2017 1:48 PM  Please note: Portions of this report may have been transcribed using dragon voice recognition software. Every effort was made to ensure accuracy; however, inadvertent computerized transcription errors may be present.

## 2017-11-22 ENCOUNTER — Encounter: Payer: Self-pay | Admitting: Physician Assistant

## 2017-11-22 ENCOUNTER — Other Ambulatory Visit: Payer: Self-pay | Admitting: Physician Assistant

## 2017-11-22 DIAGNOSIS — F988 Other specified behavioral and emotional disorders with onset usually occurring in childhood and adolescence: Secondary | ICD-10-CM

## 2017-11-22 MED ORDER — AMPHETAMINE-DEXTROAMPHET ER 30 MG PO CP24: 30.0000 mg | ORAL_CAPSULE | Freq: Two times a day (BID) | ORAL | 0 refills | Status: AC

## 2017-12-31 IMAGING — DX DG FOOT COMPLETE 3+V*R*
3 series · 3 of 3 positions shown · non-contrast
Comparison: None.

CLINICAL DATA: complaining of rt foot pain that started after she
was working out yesterday. Pt states that she landed on her foot
wrong.

EXAM:
RIGHT FOOT COMPLETE - 3+ VIEW

[foot ap]
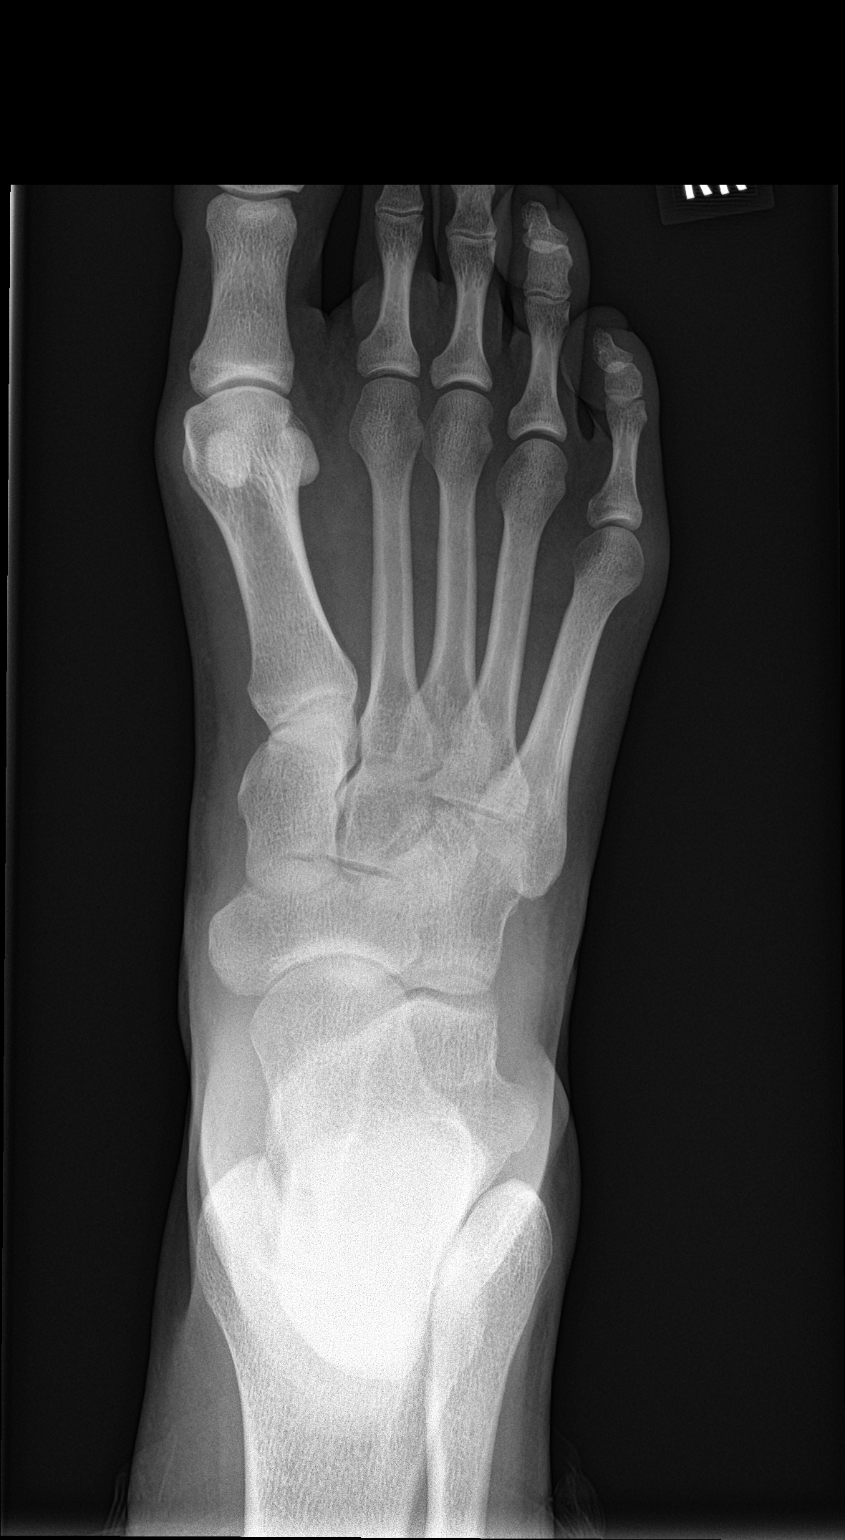

[foot obl]
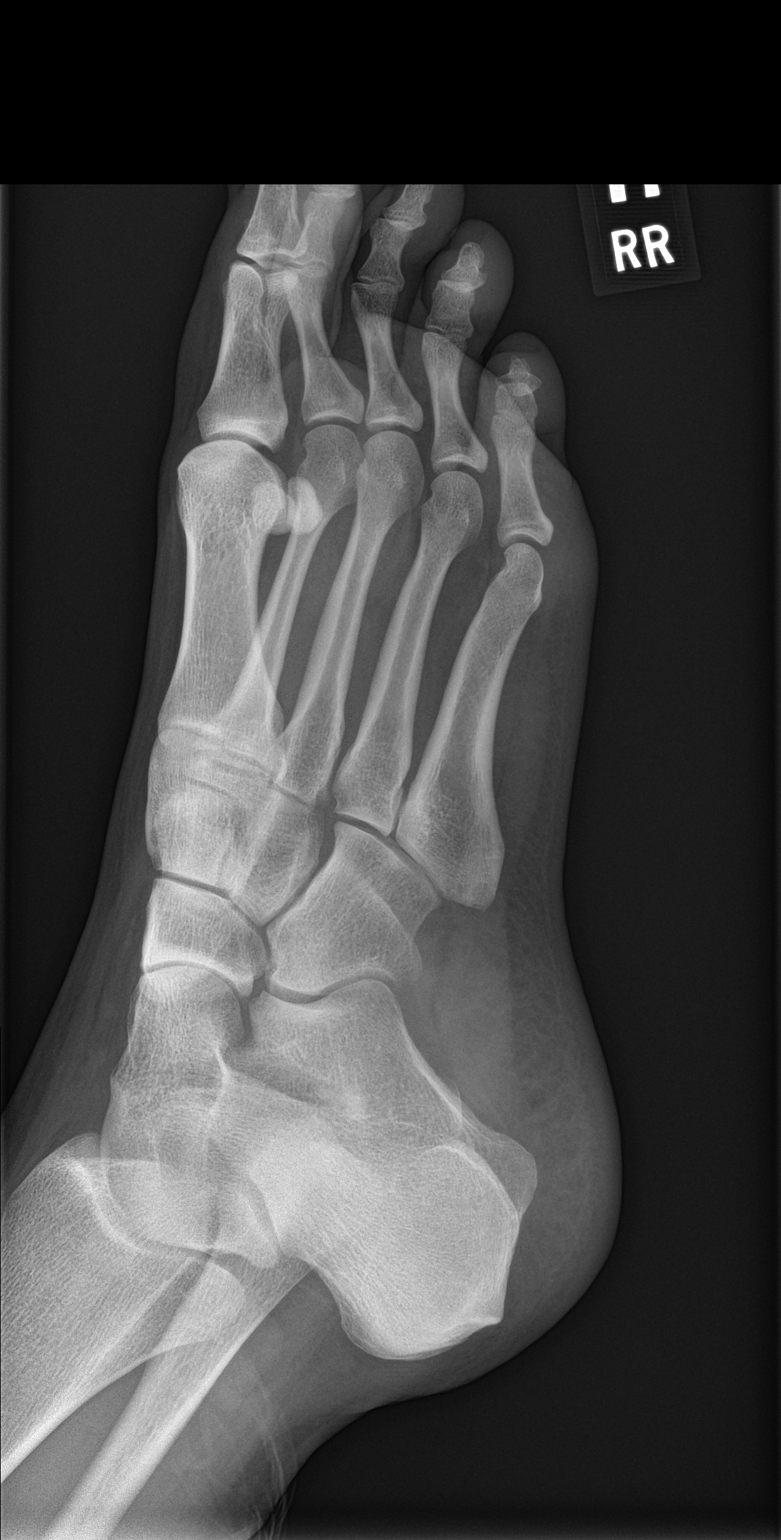

[foot lat]
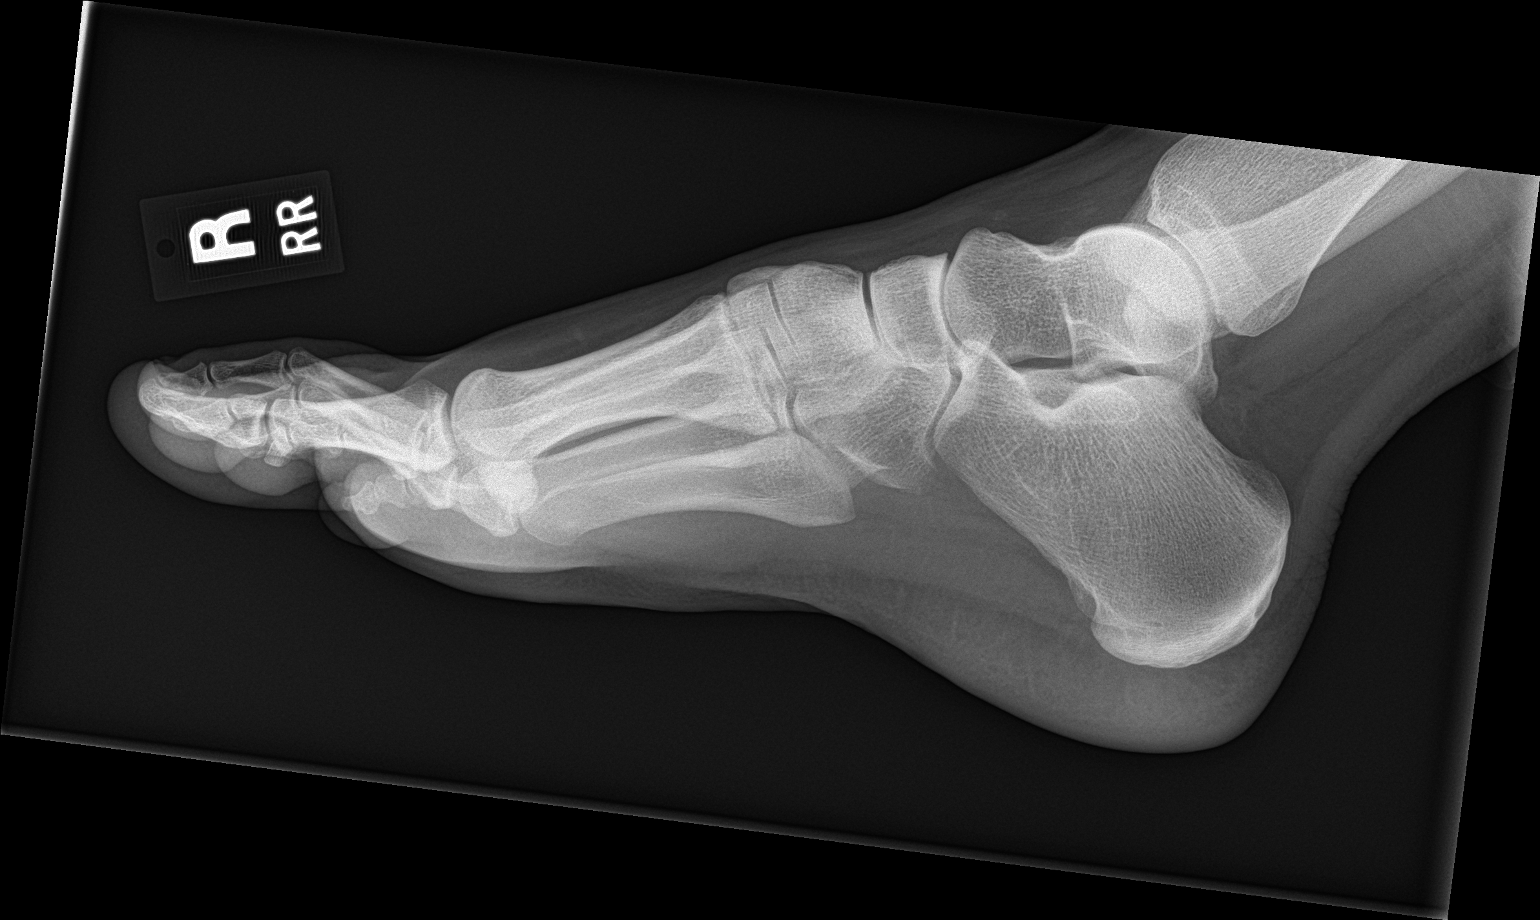

[3 of 3 positions shown; findings below may reference images not displayed]

FINDINGS: There is no evidence of fracture or dislocation. There is no
evidence of arthropathy or other focal bone abnormality. Soft
tissues are unremarkable.
IMPRESSION: Negative.

## 2022-10-28 ENCOUNTER — Other Ambulatory Visit (HOSPITAL_COMMUNITY): Payer: Self-pay

## 2022-10-28 MED ORDER — AMPHETAMINE-DEXTROAMPHET ER 25 MG PO CP24
25.0000 mg | ORAL_CAPSULE | Freq: Every morning | ORAL | 0 refills | Status: AC
  Filled 2022-10-28: qty 30, 30d supply, fill #0

## 2022-10-28 MED ORDER — AMPHETAMINE-DEXTROAMPHET ER 15 MG PO CP24
15.0000 mg | ORAL_CAPSULE | Freq: Every day | ORAL | 0 refills | Status: AC
  Filled 2022-10-28 (×2): qty 30, 30d supply, fill #0

## 2022-10-31 ENCOUNTER — Other Ambulatory Visit (HOSPITAL_COMMUNITY): Payer: Self-pay

## 2023-10-07 ENCOUNTER — Emergency Department (HOSPITAL_BASED_OUTPATIENT_CLINIC_OR_DEPARTMENT_OTHER)
Admission: EM | Admit: 2023-10-07 | Discharge: 2023-10-07 | Disposition: A | Discharge status: Home / Self Care | Source: Ambulatory Visit | Attending: Emergency Medicine | Admitting: Emergency Medicine

## 2023-10-07 ENCOUNTER — Encounter (HOSPITAL_BASED_OUTPATIENT_CLINIC_OR_DEPARTMENT_OTHER): Payer: Self-pay | Admitting: Emergency Medicine

## 2023-10-07 DIAGNOSIS — H9201 Otalgia, right ear: Secondary | ICD-10-CM | POA: Diagnosis present

## 2023-10-07 DIAGNOSIS — H9221 Otorrhagia, right ear: Secondary | ICD-10-CM | POA: Diagnosis not present

## 2023-10-07 MED ORDER — TRANEXAMIC ACID 1000 MG/10ML IV SOLN
500.0000 mg | Freq: Once | INTRAVENOUS | Status: AC
  Administered 2023-10-07: 500 mg via TOPICAL
  Filled 2023-10-07: qty 10

## 2023-10-07 NOTE — ED Notes (Signed)
 Pt given discharge instructions and extra gauze. Opportunities given for questions. Pt verbalizes understanding. Bethena Powell SAUNDERS, RN

## 2023-10-07 NOTE — ED Provider Notes (Signed)
 [place] EMERGENCY DEPARTMENT AT [facility] Provider Note   CSN: [id number] Arrival date & time: 10/07/23  1030     Patient presents with: Ear Laceration   Jennifer Whitaker is a 29 y.o. female.   HPI  Patient is a 28 year old female present emergency room today with complaints of right EAC hemorrhage.  Yesterday she had her right EAC disimpacted of earwax at a urgent care via curette.  She had some bleeding at the time but more bleeding this morning went to urgent care who directed her to the emergency room.     Prior to Admission medications   Medication Sig Start Date End Date Taking? Authorizing Provider  busPIRone (BUSPAR) 10 MG tablet Take 10 mg by mouth. 09/21/23  Yes [provider]  amphetamine -dextroamphetamine  (ADDERALL XR) 15 MG 24 hr capsule Take 1 capsule by mouth daily. 10/28/22     amphetamine -dextroamphetamine  (ADDERALL XR) 25 MG 24 hr capsule Take 1 capsule by mouth in the morning. 10/28/22     amphetamine -dextroamphetamine  (ADDERALL XR) 30 MG 24 hr capsule Take 1 capsule (30 mg total) by mouth 2 (two) times daily. 12/06/17   Weber, Lauraine CROME, PA-C  amphetamine -dextroamphetamine  (ADDERALL XR) 30 MG 24 hr capsule Take 1 capsule (30 mg total) by mouth 2 (two) times daily. 02/05/18   Allen, Lauraine CROME, PA-C  amphetamine -dextroamphetamine  (ADDERALL XR) 30 MG 24 hr capsule Take 1 capsule (30 mg total) by mouth 2 (two) times daily. 01/06/18   Weber, Lauraine CROME, PA-C  buPROPion  (WELLBUTRIN  XL) 300 MG 24 hr tablet Take 1 tablet (300 mg total) by mouth daily. 10/06/17   Weber, Lauraine CROME, PA-C  Clobetasol  Propionate (CLOBEX ) 0.05 % shampoo Apply 1 application topically daily. 10/06/17   Weber, Sarah L, PA-C  clonazePAM  (KLONOPIN ) 0.5 MG tablet TAKE 1 TABLET BY MOUTH THREE TIMES A DAY AS NEEDED FOR ANXIETY 10/06/17   Weber, Lauraine CROME, PA-C  gabapentin  (NEURONTIN ) 100 MG capsule Take 2 capsules (200 mg total) by mouth at bedtime. 10/06/17   Weber, Lauraine CROME, PA-C   norgestrel -ethinyl estradiol  (CRYSELLE -28) 0.3-30 MG-MCG tablet TAKE 1 TABLET BY MOUTH DAILY SKIPPING PLACEBOS 04/07/17   Weber, Lauraine CROME, PA-C  pantoprazole  (PROTONIX ) 40 MG tablet Take 1 tablet (40 mg total) by mouth daily. 10/06/17   Weber, Lauraine CROME, PA-C  traZODone  (DESYREL ) 50 MG tablet TAKE 1 TABLET BY MOUTH EVERYDAY AT BEDTIME 10/06/17   Weber, Lauraine CROME, PA-C    Allergies: Patient has no known allergies.    Review of Systems  Updated Vital Signs BP 125/84 (BP Location: Right Arm)   Pulse 96   Temp 98.1 F (36.7 C) (Oral)   Resp 18   LMP 09/09/2023 (Approximate)   SpO2 100%   Physical Exam Vitals and nursing note reviewed.  Constitutional:      General: She is not in acute distress.    Appearance: Normal appearance. She is not ill-appearing.  HENT:     Head: Normocephalic and atraumatic.     Ears:     Comments: Right ear EAC with excoriation/wound to posterior aspect of canal approximately 9:00 Eyes:     General: No scleral icterus.       Right eye: No discharge.        Left eye: No discharge.     Conjunctiva/sclera: Conjunctivae normal.  Pulmonary:     Effort: Pulmonary effort is normal.     Breath sounds: No stridor.  Skin:    General: Skin is warm and dry.  Neurological:     Mental Status: She is alert and oriented to person, place, and time. Mental status is at baseline.  Psychiatric:        Mood and Affect: Mood normal.        Behavior: Behavior normal.     (all labs ordered are listed, but only abnormal results are displayed) Labs Reviewed - No data to display  EKG: None  Radiology: No results found.   Procedures   Medications Ordered in the ED  tranexamic acid  (CYKLOKAPRON ) injection 500 mg (500 mg Topical Given 10/07/23 1207)                                    Medical Decision Making Risk Prescription drug management.   Patient is a 28 year old female present emergency room today with complaints of right EAC hemorrhage.  Yesterday she  had her right EAC disimpacted of earwax at a urgent care via curette.  She had some bleeding at the time but more bleeding this morning went to urgent care who directed her to the emergency room.  Patient with bleeding from [facility] after disimpaction of wax yesterday.  I instilled 3 cc of TXA into EAC  After 15 minutes emptied this out and irrigated for small amount of TXA which resulted in lightly bloody fluid return.  No active bleeding at this time.  Will discharge home at this time.   Final diagnoses:  Otorrhagia of right ear    ED Discharge Orders     None          Neldon Inoue [place], [place] 10/07/23 1625    Levander Houston, MD 10/12/23 [phone]

## 2023-10-07 NOTE — Discharge Instructions (Signed)
 Please take some gauze with you at discharge.  If you do have some additional bleeding you can pack the gauze into the ear canal and simply wait.  If you have bleeding through the gauze you can Noyes return to the emergency room for reevaluation.

## 2023-10-07 NOTE — ED Triage Notes (Signed)
 Pt caox4, ambulatory c/o bleeding from R ear since having ear wax removal done at [facility] yesterday. Pt reports she went back this morning because she has been unable to get bleeding to stop and was told by UC to get further tx in ED d/t lac being close to ear drum.
